# Patient Record
Sex: Male | Born: 1965 | Race: White | Hispanic: No | Marital: Single | Smoking: Current some day smoker
Health system: Southern US, Community
[De-identification: ages and names within clinical notes are randomized; demographics above are authoritative.]

## PROBLEM LIST (undated history)

## (undated) DIAGNOSIS — T7840XA Allergy, unspecified, initial encounter: Secondary | ICD-10-CM

## (undated) DIAGNOSIS — I10 Essential (primary) hypertension: Secondary | ICD-10-CM

## (undated) DIAGNOSIS — E785 Hyperlipidemia, unspecified: Secondary | ICD-10-CM

## (undated) DIAGNOSIS — Z720 Tobacco use: Secondary | ICD-10-CM

## (undated) DIAGNOSIS — E7800 Pure hypercholesterolemia, unspecified: Secondary | ICD-10-CM

## (undated) HISTORY — DX: Allergy, unspecified, initial encounter: T78.40XA

## (undated) HISTORY — DX: Tobacco use: Z72.0

## (undated) HISTORY — PX: NO PAST SURGERIES: SHX2092

## (undated) HISTORY — DX: Hyperlipidemia, unspecified: E78.5

---

## 2015-08-23 ENCOUNTER — Encounter: Payer: Self-pay | Admitting: *Deleted

## 2015-08-23 ENCOUNTER — Encounter: Payer: Self-pay | Admitting: Family Medicine

## 2015-08-23 ENCOUNTER — Ambulatory Visit (INDEPENDENT_AMBULATORY_CARE_PROVIDER_SITE_OTHER): Payer: BLUE CROSS/BLUE SHIELD | Admitting: Family Medicine

## 2015-08-23 VITALS — BP 98/64 | HR 68 | Temp 98.4°F | Resp 16 | Ht 72.0 in | Wt 165.0 lb

## 2015-08-23 DIAGNOSIS — E785 Hyperlipidemia, unspecified: Secondary | ICD-10-CM

## 2015-08-23 DIAGNOSIS — Z720 Tobacco use: Secondary | ICD-10-CM | POA: Diagnosis not present

## 2015-08-23 DIAGNOSIS — R634 Abnormal weight loss: Secondary | ICD-10-CM | POA: Diagnosis not present

## 2015-08-23 DIAGNOSIS — G4700 Insomnia, unspecified: Secondary | ICD-10-CM

## 2015-08-23 DIAGNOSIS — R002 Palpitations: Secondary | ICD-10-CM | POA: Insufficient documentation

## 2015-08-23 DIAGNOSIS — R079 Chest pain, unspecified: Secondary | ICD-10-CM

## 2015-08-23 DIAGNOSIS — Z125 Encounter for screening for malignant neoplasm of prostate: Secondary | ICD-10-CM | POA: Diagnosis not present

## 2015-08-23 NOTE — Progress Notes (Signed)
Patient ID: Martin Holland, male   DOB: 08/10/1966, 49 y.o.   MRN: [id number]       Patient: Martin Holland Male    DOB: 1966/11/22   49 y.o.   MRN: [id number] Visit Date: 08/23/2015  Today's Provider: Lelon Huh, MD   No chief complaint on file.  Subjective:    HPI Weight loss Patient reports that he has unexpectedly loss almost 20lbs in the past 6 months. He states he has felt well and only noticed it because acquaintances have been commenting that he looks like he is losing weight. Patient reports that he has not changed eating habits. However, he tends to skip breakfast and does not eat dinner until 8-9 o'clock. No nausea or appetite loss. No cough, fevers, chills or sweats. No changes in bowels.   He has had several episodes of tightness in his chest, not associated with exertion. They are mild in severity and resolve spontaneously after 4 or 5 minutes. They have been occuring a few time a week for awhile.   He also has long history of migraines usually with visual trigger such as bright lights. In the past he has only had these every year or so, but has had two of them in the last month. They last a half hour or so and are identical to migraines he has had in the past.       No Known Allergies Previous Medications   MULTIPLE VITAMIN PO    Take 1 capsule by mouth daily.    Review of Systems  Constitutional: Positive for unexpected weight change. Negative for fever, chills and appetite change.  Respiratory: Negative.  Negative for shortness of breath and wheezing.   Cardiovascular: Positive for palpitations. Negative for chest pain.  Gastrointestinal: Negative.  Negative for nausea, vomiting and abdominal pain.  Musculoskeletal: Positive for myalgias.  Psychiatric/Behavioral: Negative for behavioral problems and dysphoric mood. The patient is not nervous/anxious.        Insomnia   Patient Active Problem List   Diagnosis Date Noted  . Hyperlipidemia 08/23/2015  .  Palpitations 08/23/2015  . Tobacco abuse 08/23/2015    Social History  Substance Use Topics  . Smoking status: Current Every Day Smoker  . Smokeless tobacco: Not on file  . Alcohol Use: 0.0 oz/week    0 Standard drinks or equivalent per week   Objective:   BP 98/64 mmHg  Pulse 68  Temp(Src) 98.4 F (36.9 C)  Resp 16  Ht 6' (1.829 m)  Wt 165 lb (74.844 kg)  BMI 22.37 kg/m2  Physical Exam   General Appearance:    Alert, cooperative, no distress  Eyes:    PERRL, conjunctiva/corneas clear, EOM's intact       Lungs:     Clear to auscultation bilaterally, respirations unlabored  Heart:    Regular rate and rhythm  Neurologic:   Awake, alert, oriented x 3. No apparent focal neurological           defect.   Abd:   Soft, non-tender, no masses, bowel sounds normoactive x 4, no tenderness.        Assessment & Plan:     1. Unintentional weight loss  - CBC - Comprehensive metabolic panel - T4 AND TSH - Lipid panel  2. Insomnia   3. Hyperlipidemia He stopped pravastatin after a few months due severe muscle cramps, and has since been on healthier low fat diet.   4. Tobacco abuse Stop smoking  5. Prostate cancer screening  -  PSA  6. Chest pain, unspecified chest pain type Non typical for cardiac pain, but he does have several cardiac risk factors including smoking and very high cholesterol.  - DG Chest 2 View; Future - EKG 12-Lead - Troponin I       Lelon Huh, MD  [facility]

## 2015-08-23 NOTE — Patient Instructions (Signed)
Remmber to take your lab requisition to [facility] on Monday You do not need a requisition for your Chest Xray at [facility]

## 2015-08-24 ENCOUNTER — Ambulatory Visit
Admission: RE | Admit: 2015-08-24 | Discharge: 2015-08-24 | Disposition: A | Discharge status: Home / Self Care | Payer: BLUE CROSS/BLUE SHIELD | Source: Ambulatory Visit | Attending: Family Medicine | Admitting: Family Medicine

## 2015-08-24 ENCOUNTER — Other Ambulatory Visit
Admission: RE | Admit: 2015-08-24 | Discharge: 2015-08-24 | Disposition: A | Discharge status: Home / Self Care | Payer: BLUE CROSS/BLUE SHIELD | Source: Ambulatory Visit | Attending: Family Medicine | Admitting: Family Medicine

## 2015-08-24 DIAGNOSIS — R079 Chest pain, unspecified: Secondary | ICD-10-CM | POA: Diagnosis present

## 2015-08-24 LAB — CBC
HCT: 46.9 % (ref 40.0–52.0)
HEMOGLOBIN: 16 g/dL (ref 13.0–18.0)
MCH: 31.3 pg (ref 26.0–34.0)
MCHC: 34.1 g/dL (ref 32.0–36.0)
MCV: 91.7 fL (ref 80.0–100.0)
Platelets: 181 10*3/uL (ref 150–440)
RBC: 5.12 MIL/uL (ref 4.40–5.90)
RDW: 13.1 % (ref 11.5–14.5)
WBC: 7.6 10*3/uL (ref 3.8–10.6)

## 2015-08-24 LAB — COMPREHENSIVE METABOLIC PANEL
ALK PHOS: 63 U/L (ref 38–126)
ALT: 19 U/L (ref 17–63)
AST: 20 U/L (ref 15–41)
Albumin: 4.6 g/dL (ref 3.5–5.0)
Anion gap: 9 (ref 5–15)
BUN: 14 mg/dL (ref 6–20)
CALCIUM: 9.4 mg/dL (ref 8.9–10.3)
CO2: 27 mmol/L (ref 22–32)
CREATININE: 0.9 mg/dL (ref 0.61–1.24)
Chloride: 101 mmol/L (ref 101–111)
GFR calc non Af Amer: >60 mL/min (ref >60)
GLUCOSE: 102 mg/dL — AB (ref 65–99)
Potassium: 4.4 mmol/L (ref 3.5–5.1)
SODIUM: 137 mmol/L (ref 135–145)
Total Bilirubin: 0.6 mg/dL (ref 0.3–1.2)
Total Protein: 7.7 g/dL (ref 6.5–8.1)

## 2015-08-26 ENCOUNTER — Other Ambulatory Visit
Admission: RE | Admit: 2015-08-26 | Discharge: 2015-08-26 | Disposition: A | Discharge status: Home / Self Care | Payer: BLUE CROSS/BLUE SHIELD | Source: Ambulatory Visit | Attending: Family Medicine | Admitting: Family Medicine

## 2015-08-26 DIAGNOSIS — R634 Abnormal weight loss: Secondary | ICD-10-CM | POA: Diagnosis not present

## 2015-08-26 DIAGNOSIS — Z125 Encounter for screening for malignant neoplasm of prostate: Secondary | ICD-10-CM | POA: Diagnosis present

## 2015-08-26 DIAGNOSIS — R079 Chest pain, unspecified: Secondary | ICD-10-CM | POA: Diagnosis not present

## 2015-08-26 LAB — TROPONIN I

## 2015-08-26 LAB — LIPID PANEL
CHOL/HDL RATIO: 4.6 ratio
Cholesterol: 234 mg/dL — ABNORMAL HIGH (ref 0–200)
HDL: 51 mg/dL (ref >40)
LDL CALC: 153 mg/dL — AB (ref 0–99)
Triglycerides: 149 mg/dL (ref <150)
VLDL: 30 mg/dL (ref 0–40)

## 2015-08-26 LAB — TSH: TSH: 1.04 u[IU]/mL (ref 0.350–4.500)

## 2015-08-27 ENCOUNTER — Telehealth: Payer: Self-pay | Admitting: Family Medicine

## 2015-08-27 LAB — T4: T4 TOTAL: 7.7 ug/dL (ref 4.5–12.0)

## 2015-08-27 NOTE — Telephone Encounter (Signed)
Pt states he had chest x-ray and lab work done at the urgent care in [place] on Saturday.  Pt is asking if we have results from these.  HB#[phone]/BO

## 2015-08-27 NOTE — Telephone Encounter (Signed)
Left detailed message vm letting pt know we did receive results.

## 2015-08-30 ENCOUNTER — Encounter: Payer: Self-pay | Admitting: Family Medicine

## 2015-09-06 ENCOUNTER — Telehealth: Payer: Self-pay | Admitting: Family Medicine

## 2015-09-06 NOTE — Telephone Encounter (Addendum)
LMOVM for pt to return call. See results note.

## 2015-09-06 NOTE — Telephone Encounter (Signed)
Pt is returning call.  OB#[phone]/[id number]

## 2015-09-10 ENCOUNTER — Telehealth: Payer: Self-pay | Admitting: Family Medicine

## 2015-09-10 NOTE — Telephone Encounter (Signed)
Pt is returning a call to [place].  GO#[phone]/DH

## 2015-09-10 NOTE — Telephone Encounter (Signed)
Left message to call back.  Thanks, 

## 2015-09-26 ENCOUNTER — Encounter: Payer: Self-pay | Admitting: *Deleted

## 2015-09-26 NOTE — Telephone Encounter (Signed)
Please see other message in lab results-aa

## 2015-10-01 ENCOUNTER — Encounter: Payer: Self-pay | Admitting: Family Medicine

## 2015-10-01 LAB — PSA, SERUM (SERIAL MONITOR)

## 2016-04-13 DIAGNOSIS — I10 Essential (primary) hypertension: Secondary | ICD-10-CM | POA: Insufficient documentation

## 2016-04-13 DIAGNOSIS — K219 Gastro-esophageal reflux disease without esophagitis: Secondary | ICD-10-CM | POA: Insufficient documentation

## 2016-04-14 ENCOUNTER — Telehealth: Payer: Self-pay | Admitting: Family Medicine

## 2016-04-14 DIAGNOSIS — R7303 Prediabetes: Secondary | ICD-10-CM | POA: Insufficient documentation

## 2016-04-14 NOTE — Telephone Encounter (Signed)
Pt was discharged from [facility] today.  Pt will come by to complete a medical record release form.  I have scheduled a hospital follow up appointment/MW

## 2016-04-28 ENCOUNTER — Inpatient Hospital Stay: Payer: BLUE CROSS/BLUE SHIELD | Admitting: Family Medicine

## 2016-05-11 ENCOUNTER — Ambulatory Visit (INDEPENDENT_AMBULATORY_CARE_PROVIDER_SITE_OTHER): Payer: BLUE CROSS/BLUE SHIELD | Admitting: Family Medicine

## 2016-05-11 ENCOUNTER — Encounter: Payer: Self-pay | Admitting: Family Medicine

## 2016-05-11 VITALS — BP 112/72 | HR 62 | Temp 98.0°F | Resp 16 | Ht 72.0 in | Wt 168.0 lb

## 2016-05-11 DIAGNOSIS — Z1211 Encounter for screening for malignant neoplasm of colon: Secondary | ICD-10-CM | POA: Diagnosis not present

## 2016-05-11 DIAGNOSIS — I10 Essential (primary) hypertension: Secondary | ICD-10-CM | POA: Diagnosis not present

## 2016-05-11 DIAGNOSIS — R079 Chest pain, unspecified: Secondary | ICD-10-CM | POA: Insufficient documentation

## 2016-05-11 NOTE — Progress Notes (Signed)
       Patient: Martin Holland Male    DOB: 06/09/1966   50 y.o.   MRN: [id number] Visit Date: 05/11/2016  Today's Provider: Lelon Huh, MD   Chief Complaint  Patient presents with  . Hospitalization Follow-up   Subjective:    HPI   Follow up Hospitalization  Patient was admitted to [facility] on 04/13/2016 and discharged on 04/14/2016. He was treated for accelerated HTN and Chest pain. Treatment for this included patient was started on HCTZ 12.5 mg qd and Protonix 40 mg qd. Patient to continue taking 325 mg aspirin and discontinue ibuprofen. Patient was free of chest pain upon discharge. Advised to follow-up with pcp in 4 weeks after discharge.   He had stress echo which was interpretated as low risk. Chest CT was completely normal. He was also noted to have LDL=160 with chol:HDL ratio=5 He was also started on pantoprazole, but states he rarely has heartburn.   He reports good compliance with treatment. He reports this condition is Improved.  ----------------------------------------------------------------------      Allergies  Allergen Reactions  . Pravastatin Sodium     Severe muscle cramps   Current Meds  Medication Sig  . aspirin 325 MG tablet Take 1 tablet by mouth daily.  . hydrochlorothiazide (HYDRODIURIL) 12.5 MG tablet Take 1 tablet by mouth daily.  . pantoprazole (PROTONIX) 40 MG tablet Take 1 tablet by mouth daily.         Review of Systems  Constitutional: Negative for fever, chills and appetite change.  Respiratory: Negative for chest tightness, shortness of breath and wheezing.   Cardiovascular: Negative for chest pain and palpitations.  Gastrointestinal: Negative for nausea, vomiting and abdominal pain.    Social History  Substance Use Topics  . Smoking status: Current Every Day Smoker  . Smokeless tobacco: Not on file  . Alcohol Use: 0.0 oz/week    0 Standard drinks or equivalent per week   Objective:   BP 112/72 mmHg  Pulse 62   Temp(Src) 98 F (36.7 C) (Oral)  Resp 16  Ht 6' (1.829 m)  Wt 168 lb (76.204 kg)  BMI 22.78 kg/m2  SpO2 98%  Physical Exam   General Appearance:    Alert, cooperative, no distress  Eyes:    PERRL, conjunctiva/corneas clear, EOM's intact       Lungs:     Clear to auscultation bilaterally, respirations unlabored  Heart:    Regular rate and rhythm  Neurologic:   Awake, alert, oriented x 3. No apparent focal neurological           defect.           Assessment & Plan:     1. Essential hypertension Doing well since starting 12.5mg  hctz daily. Counseled on importance of smoking cessation  Return in 3 months.   2. Chest pain, unspecified chest pain type Negative cardiac work up. May have been related to accelerated hypertension. He is to complete one month of PPI.   3. Colon cancer screening  - Ambulatory referral to Gastroenterology      The entirety of the information documented in the History of Present Illness, Review of Systems and Physical Exam were personally obtained by me. Portions of this information were initially documented by April M. Sabra Heck, CMA and reviewed by me for thoroughness and accuracy.    Lelon Huh, MD  [facility]

## 2016-07-13 ENCOUNTER — Telehealth: Payer: Self-pay | Admitting: Gastroenterology

## 2016-07-13 NOTE — Telephone Encounter (Signed)
colonoscopy

## 2016-07-15 NOTE — Telephone Encounter (Signed)
Called patient and left a voicemail 

## 2016-07-16 ENCOUNTER — Other Ambulatory Visit: Payer: Self-pay

## 2016-07-16 NOTE — Telephone Encounter (Signed)
Gastroenterology Pre-Procedure Review  Request Date: *08/14/16* Requesting Physician: Dr.   PATIENT REVIEW QUESTIONS: The patient responded to the following health history questions as indicated:    1. Are you having any GI issues? no 2. Do you have a personal history of Polyps? no 3. Do you have a family history of Colon Cancer or Polyps? no 4. Diabetes Mellitus? no 5. Joint replacements in the past 12 months?no 6. Major health problems in the past 3 months?no 7. Any artificial heart valves, MVP, or defibrillator?no    MEDICATIONS & ALLERGIES:    Patient reports the following regarding taking any anticoagulation/antiplatelet therapy:   Plavix, Coumadin, Eliquis, Xarelto, Lovenox, Pradaxa, Brilinta, or Effient? no Aspirin? yes (ASA 325mg )  Patient confirms/reports the following medications:  Current Outpatient Prescriptions  Medication Sig Dispense Refill  . aspirin 325 MG tablet Take 1 tablet by mouth daily.    . hydrochlorothiazide (HYDRODIURIL) 12.5 MG tablet Take 1 tablet by mouth daily.    . MULTIPLE VITAMIN PO Take 1 capsule by mouth daily. Reported on 05/11/2016    . pantoprazole (PROTONIX) 40 MG tablet Take 1 tablet by mouth daily.     No current facility-administered medications for this visit.     Patient confirms/reports the following allergies:  Allergies  Allergen Reactions  . Pravastatin Sodium     Severe muscle cramps    No orders of the defined types were placed in this encounter.   AUTHORIZATION INFORMATION Primary Insurance: 1D#: Group #:  Secondary Insurance: 1D#: Group #:  SCHEDULE INFORMATION: Date: 08/14/16 Time: Location: [place]

## 2016-08-07 ENCOUNTER — Encounter: Payer: Self-pay | Admitting: *Deleted

## 2016-08-07 NOTE — Discharge Instructions (Signed)

## 2016-08-14 ENCOUNTER — Ambulatory Visit
Admission: RE | Admit: 2016-08-14 | Discharge: 2016-08-14 | Disposition: A | Discharge status: Home / Self Care | Payer: BLUE CROSS/BLUE SHIELD | Source: Ambulatory Visit | Attending: Gastroenterology | Admitting: Gastroenterology

## 2016-08-14 ENCOUNTER — Ambulatory Visit: Payer: BLUE CROSS/BLUE SHIELD | Admitting: Anesthesiology

## 2016-08-14 ENCOUNTER — Encounter
Admission: RE | Disposition: A | Discharge status: Home / Self Care | Payer: Self-pay | Source: Ambulatory Visit | Attending: Gastroenterology

## 2016-08-14 DIAGNOSIS — F17200 Nicotine dependence, unspecified, uncomplicated: Secondary | ICD-10-CM | POA: Insufficient documentation

## 2016-08-14 DIAGNOSIS — Z8249 Family history of ischemic heart disease and other diseases of the circulatory system: Secondary | ICD-10-CM | POA: Diagnosis not present

## 2016-08-14 DIAGNOSIS — K5730 Diverticulosis of large intestine without perforation or abscess without bleeding: Secondary | ICD-10-CM | POA: Diagnosis not present

## 2016-08-14 DIAGNOSIS — Z1211 Encounter for screening for malignant neoplasm of colon: Secondary | ICD-10-CM | POA: Diagnosis not present

## 2016-08-14 DIAGNOSIS — Z809 Family history of malignant neoplasm, unspecified: Secondary | ICD-10-CM | POA: Diagnosis not present

## 2016-08-14 DIAGNOSIS — D125 Benign neoplasm of sigmoid colon: Secondary | ICD-10-CM | POA: Diagnosis not present

## 2016-08-14 DIAGNOSIS — E785 Hyperlipidemia, unspecified: Secondary | ICD-10-CM | POA: Insufficient documentation

## 2016-08-14 DIAGNOSIS — Z7982 Long term (current) use of aspirin: Secondary | ICD-10-CM | POA: Insufficient documentation

## 2016-08-14 DIAGNOSIS — Z888 Allergy status to other drugs, medicaments and biological substances status: Secondary | ICD-10-CM | POA: Insufficient documentation

## 2016-08-14 DIAGNOSIS — I10 Essential (primary) hypertension: Secondary | ICD-10-CM | POA: Diagnosis not present

## 2016-08-14 DIAGNOSIS — Z79899 Other long term (current) drug therapy: Secondary | ICD-10-CM | POA: Diagnosis not present

## 2016-08-14 DIAGNOSIS — K640 First degree hemorrhoids: Secondary | ICD-10-CM | POA: Insufficient documentation

## 2016-08-14 DIAGNOSIS — Z860101 Personal history of adenomatous and serrated colon polyps: Secondary | ICD-10-CM

## 2016-08-14 DIAGNOSIS — Z86010 Personal history of colonic polyps: Secondary | ICD-10-CM

## 2016-08-14 HISTORY — PX: COLONOSCOPY WITH PROPOFOL: SHX5780

## 2016-08-14 HISTORY — DX: Essential (primary) hypertension: I10

## 2016-08-14 SURGERY — COLONOSCOPY WITH PROPOFOL
Anesthesia: Monitor Anesthesia Care

## 2016-08-14 MED ORDER — LIDOCAINE HCL (CARDIAC) 20 MG/ML IV SOLN
INTRAVENOUS | Status: DC | PRN
  Administered 2016-08-14: 40 mg via INTRAVENOUS

## 2016-08-14 MED ORDER — PROPOFOL 10 MG/ML IV BOLUS
INTRAVENOUS | Status: DC | PRN
  Administered 2016-08-14 (×6): 50 mg via INTRAVENOUS

## 2016-08-14 MED ORDER — STERILE WATER FOR IRRIGATION IR SOLN
Status: DC | PRN
  Administered 2016-08-14: 10:00:00

## 2016-08-14 MED ORDER — LACTATED RINGERS IV SOLN: 500.0000 mL | INTRAVENOUS | Status: DC

## 2016-08-14 MED ORDER — LACTATED RINGERS IV SOLN
INTRAVENOUS | Status: DC
  Administered 2016-08-14: 09:00:00 via INTRAVENOUS

## 2016-08-14 SURGICAL SUPPLY — 23 items

## 2016-08-14 NOTE — Anesthesia Postprocedure Evaluation (Signed)
Anesthesia Post Note  Patient: Martin Holland  Procedure(s) Performed: Procedure(s) (LRB): COLONOSCOPY WITH PROPOFOL (N/A)  Patient location during evaluation: PACU Anesthesia Type: MAC Level of consciousness: awake and alert Pain management: pain level controlled Vital Signs Assessment: post-procedure vital signs reviewed and stable Respiratory status: spontaneous breathing, nonlabored ventilation and respiratory function stable Cardiovascular status: stable and blood pressure returned to baseline Anesthetic complications: no    DANIEL D KOVACS

## 2016-08-14 NOTE — Transfer of Care (Signed)
Immediate Anesthesia Transfer of Care Note  Patient: Martin Holland  Procedure(s) Performed: Procedure(s): COLONOSCOPY WITH PROPOFOL (N/A)  Patient Location: PACU  Anesthesia Type: MAC  Level of Consciousness: awake, alert  and patient cooperative  Airway and Oxygen Therapy: Patient Spontanous Breathing and Patient connected to supplemental oxygen  Post-op Assessment: Post-op Vital signs reviewed, Patient's Cardiovascular Status Stable, Respiratory Function Stable, Patent Airway and No signs of Nausea or vomiting  Post-op Vital Signs: Reviewed and stable  Complications: No apparent anesthesia complications

## 2016-08-14 NOTE — H&P (Signed)
  Lucilla Lame, MD [facility] [address], [place], [place] [address] Phone: [phone] Fax : [phone]  Primary Care Physician:  Lelon Huh, MD Primary Gastroenterologist:  Dr. Allen Norris  Pre-Procedure History & Physical: HPI:  Martin Holland is a 50 y.o. male is here for a screening colonoscopy.   Past Medical History:  Diagnosis Date  . Hyperlipidemia   . Hypertension     Past Surgical History:  Procedure Laterality Date  . NO PAST SURGERIES      Prior to Admission medications   Medication Sig Start Date End Date Taking? Authorizing Provider  aspirin 325 MG tablet Take 1 tablet by mouth daily.   Yes Historical Provider, MD  hydrochlorothiazide (HYDRODIURIL) 12.5 MG tablet Take 1 tablet by mouth daily. 04/14/16 04/14/17 Yes Historical Provider, MD  MULTIPLE VITAMIN PO Take 1 capsule by mouth daily. Reported on 05/11/2016   Yes Historical Provider, MD    Allergies as of 07/16/2016 - Review Complete 05/11/2016  Allergen Reaction Noted  . Pravastatin sodium  08/24/2015    Family History  Problem Relation Age of Onset  . Heart disease Father   . Cancer Mother     small cell carcinoma    Social History   Social History  . Marital status: Single    Spouse name: N/A  . Number of children: 0  . Years of education: N/A   Occupational History  . Employeed    Social History Main Topics  . Smoking status: Current Every Day Smoker    Packs/day: 0.25    Years: 20.00  . Smokeless tobacco: Never Used  . Alcohol use 2.4 oz/week    4 Cans of beer per week  . Drug use:     Types: Marijuana  . Sexual activity: Not on file   Other Topics Concern  . Not on file   Social History Narrative  . No narrative on file    Review of Systems: See HPI, otherwise negative ROS  Physical Exam: BP (!) 144/92   Pulse 64   Temp 98 F (36.7 C) (Temporal)   Ht 6' (1.829 m)   Wt 163 lb (73.9 kg)   SpO2 99%   BMI 22.11 kg/m  General:   Alert,  pleasant and cooperative in  NAD Head:  Normocephalic and atraumatic. Neck:  Supple; no masses or thyromegaly. Lungs:  Clear throughout to auscultation.    Heart:  Regular rate and rhythm. Abdomen:  Soft, nontender and nondistended. Normal bowel sounds, without guarding, and without rebound.   Neurologic:  Alert and  oriented x4;  grossly normal neurologically.  Impression/Plan: Martin Holland is now here to undergo a screening colonoscopy.  Risks, benefits, and alternatives regarding colonoscopy have been reviewed with the patient.  Questions have been answered.  All parties agreeable.

## 2016-08-14 NOTE — Anesthesia Preprocedure Evaluation (Signed)
Anesthesia Evaluation  Patient identified by MRN, date of birth, ID band Patient awake    Reviewed: Allergy & Precautions, H&P , NPO status , Patient's Chart, lab work & pertinent test results, reviewed documented beta blocker date and time   Airway Mallampati: II  TM Distance: >3 FB Neck ROM: full    Dental no notable dental hx.    Pulmonary Current Smoker,    Pulmonary exam normal breath sounds clear to auscultation       Cardiovascular Exercise Tolerance: Good hypertension,  Rhythm:regular Rate:Normal     Neuro/Psych negative neurological ROS  negative psych ROS   GI/Hepatic negative GI ROS, Neg liver ROS,   Endo/Other  negative endocrine ROS  Renal/GU negative Renal ROS  negative genitourinary   Musculoskeletal   Abdominal   Peds  Hematology negative hematology ROS (+)   Anesthesia Other Findings   Reproductive/Obstetrics negative OB ROS                             Anesthesia Physical Anesthesia Plan  ASA: II  Anesthesia Plan: MAC   Post-op Pain Management:    Induction:   Airway Management Planned:   Additional Equipment:   Intra-op Plan:   Post-operative Plan:   Informed Consent: I have reviewed the patients History and Physical, chart, labs and discussed the procedure including the risks, benefits and alternatives for the proposed anesthesia with the patient or authorized representative who has indicated his/her understanding and acceptance.     Plan Discussed with: CRNA  Anesthesia Plan Comments:         Anesthesia Quick Evaluation  

## 2016-08-14 NOTE — Op Note (Signed)
[facility] Gastroenterology Patient Name: Martin Holland Procedure Date: 08/14/2016 9:42 AM MRN: [id number] Account #: [id number] Date of Birth: December 10, 1965 Admit Type: Outpatient Age: 50 Room: [facility] OR ROOM 01 Gender: Male Note Status: Finalized Procedure:            Colonoscopy Indications:          Screening for colorectal malignant neoplasm Providers:            Lucilla Lame MD, MD Referring MD:         Kirstie Peri. Caryn Section, MD (Referring MD) Medicines:            Propofol per Anesthesia Complications:        No immediate complications. Procedure:            Pre-Anesthesia Assessment:                       - Prior to the procedure, a History and Physical was                        performed, and patient medications and allergies were                        reviewed. The patient's tolerance of previous                        anesthesia was also reviewed. The risks and benefits of                        the procedure and the sedation options and risks were                        discussed with the patient. All questions were                        answered, and informed consent was obtained. Prior                        Anticoagulants: The patient has taken no previous                        anticoagulant or antiplatelet agents. ASA Grade                        Assessment: II - A patient with mild systemic disease.                        After reviewing the risks and benefits, the patient was                        deemed in satisfactory condition to undergo the                        procedure.                       After obtaining informed consent, the colonoscope was                        passed under direct vision. Throughout the procedure,  the patient's blood pressure, pulse, and oxygen                        saturations were monitored continuously. The Olympus                        CF-HQ190L Colonoscope (S#. [id number]) was introduced                      through the anus and advanced to the the terminal                        ileum. The colonoscopy was performed without                        difficulty. The patient tolerated the procedure well.                        The quality of the bowel preparation was excellent. Findings:      The perianal and digital rectal examinations were normal.      The terminal ileum appeared normal.      Two sessile polyps were found in the sigmoid colon. The polyps were 2 to       3 mm in size. These polyps were removed with a cold biopsy forceps.       Resection and retrieval were complete.      Multiple small-mouthed diverticula were found in the sigmoid colon.      Non-bleeding internal hemorrhoids were found during retroflexion. The       hemorrhoids were Grade I (internal hemorrhoids that do not prolapse). Impression:           - The examined portion of the ileum was normal.                       - Two 2 to 3 mm polyps in the sigmoid colon, removed                        with a cold biopsy forceps. Resected and retrieved.                       - Diverticulosis in the sigmoid colon.                       - Non-bleeding internal hemorrhoids. Recommendation:       - Discharge patient to home.                       - Resume previous diet.                       - Continue present medications.                       - Await pathology results.                       - Repeat colonoscopy in 5 years if polyp adenoma and 10                        years if hyperplastic Procedure Code(s):    --- Professional ---  45380, Colonoscopy, flexible; with biopsy, single or                        multiple Diagnosis Code(s):    --- Professional ---                       Z12.11, Encounter for screening for malignant neoplasm                        of colon                       D12.5, Benign neoplasm of sigmoid colon CPT copyright 2016 American Medical Association. All rights  reserved. The codes documented in this report are preliminary and upon coder review may  be revised to meet current compliance requirements. Lucilla Lame MD, MD 08/14/2016 10:11:28 AM This report has been signed electronically. Number of Addenda: 0 Note Initiated On: 08/14/2016 9:42 AM Total Procedure Duration: 0 hours 10 minutes 1 second       [facility]

## 2016-08-17 ENCOUNTER — Encounter: Payer: Self-pay | Admitting: Gastroenterology

## 2016-08-18 ENCOUNTER — Encounter: Payer: Self-pay | Admitting: Gastroenterology

## 2017-03-22 ENCOUNTER — Other Ambulatory Visit: Payer: Self-pay | Admitting: Family Medicine

## 2017-03-22 NOTE — Telephone Encounter (Signed)
Pt needs new yearly refill   hydrochlorothiazide (HYDRODIURIL) 12.5 MG tablet  [facility] [address]  Pt's call back [phone]  Thanks Con Memos

## 2017-03-23 ENCOUNTER — Other Ambulatory Visit: Payer: Self-pay | Admitting: *Deleted

## 2017-03-23 MED ORDER — HYDROCHLOROTHIAZIDE 12.5 MG PO TABS: 12.5000 mg | ORAL_TABLET | Freq: Every day | ORAL | 5 refills | Status: DC

## 2017-03-23 MED ORDER — HYDROCHLOROTHIAZIDE 12.5 MG PO TABS: 12.5000 mg | ORAL_TABLET | Freq: Every day | ORAL | 2 refills | Status: DC

## 2017-11-26 ENCOUNTER — Ambulatory Visit: Payer: Self-pay | Admitting: Family Medicine

## 2017-11-26 ENCOUNTER — Encounter: Payer: Self-pay | Admitting: Family Medicine

## 2017-11-26 VITALS — BP 110/84 | HR 80 | Temp 98.2°F | Resp 16 | Wt 170.0 lb

## 2017-11-26 DIAGNOSIS — R630 Anorexia: Secondary | ICD-10-CM

## 2017-11-26 DIAGNOSIS — Z23 Encounter for immunization: Secondary | ICD-10-CM

## 2017-11-26 NOTE — Progress Notes (Signed)
Patient: Martin Holland Male    DOB: 05-23-1966   51 y.o.   MRN: [id number] Visit Date: 11/26/2017  Today's Provider: Lelon Huh, MD   No chief complaint on file.  Subjective:    Abdominal Cramping  This is a recurrent problem. The current episode started more than 1 month ago (3 months). The onset quality is gradual. The problem occurs daily. The problem has been unchanged. The pain is located in the generalized abdominal region. The pain is mild. The quality of the pain is cramping. Associated symptoms include anorexia, belching, diarrhea, flatus and weight loss. Pertinent negatives include no constipation, dysuria, fever, frequency, headaches, hematochezia, hematuria, melena, myalgias, nausea or vomiting. The pain is aggravated by eating. The pain is relieved by liquids. The treatment provided moderate relief.   First noticed symptoms the morning after he had been drinking with friends at beach and lasted a few days. Symptoms mostly resolved but flares up from time to time since then, most recently about a week ago. States he feels full very quickly and does not want to eat anymore after taking a few bites, or sips of soup. No heartburn. Rarely has burning in stomach. No feeling of food getting stuck in throat or chest. No change in bowel movements. Cramping is very brief and mild. Has never been a regular smoker. He is a light social drinker. Typically has three or four drinks a few times a week or month.    Wt Readings from Last 3 Encounters:  11/26/17 170 lb (77.1 kg)  08/14/16 163 lb (73.9 kg)  05/11/16 168 lb (76.2 kg)       Allergies  Allergen Reactions  . Pravastatin Sodium     Severe muscle cramps     Current Outpatient Medications:  .  aspirin 325 MG tablet, Take 1 tablet by mouth daily., Disp: , Rfl:  .  hydrochlorothiazide (HYDRODIURIL) 12.5 MG tablet, Take 1 tablet (12.5 mg total) by mouth daily., Disp: 90 tablet, Rfl: 2 .  MULTIPLE VITAMIN PO, Take 1  capsule by mouth daily. Reported on 05/11/2016, Disp: , Rfl:   Review of Systems  Constitutional: Positive for weight loss. Negative for appetite change, chills and fever.  Respiratory: Negative for chest tightness, shortness of breath and wheezing.   Cardiovascular: Negative for chest pain and palpitations.  Gastrointestinal: Positive for anorexia, diarrhea and flatus. Negative for constipation, hematochezia, melena, nausea and vomiting.  Genitourinary: Negative for dysuria, frequency and hematuria.  Musculoskeletal: Negative for myalgias.  Neurological: Negative for headaches.    Social History   Tobacco Use  . Smoking status: Current Every Day Smoker    Packs/day: 0.25    Years: 20.00    Pack years: 5.00  . Smokeless tobacco: Never Used  Substance Use Topics  . Alcohol use: Yes    Alcohol/week: 2.4 oz    Types: 4 Cans of beer per week   Objective:   BP 110/84 (BP Location: Right Arm, Patient Position: Sitting, Cuff Size: Normal)   Pulse 80   Temp 98.2 F (36.8 C) (Oral)   Resp 16   Wt 170 lb (77.1 kg)   SpO2 98%   BMI 23.06 kg/m  There were no vitals filed for this visit.   Physical Exam  General Appearance:    Alert, cooperative, no distress  Eyes:    PERRL, conjunctiva/corneas clear, EOM's intact       Lungs:     Clear to auscultation bilaterally, respirations unlabored  Heart:    Regular rate and rhythm  Abdomen:   bowel sounds present and normal in all 4 quadrants, soft, round, nontender or nondistended. No CVA tenderness         Assessment & Plan:     1. Loss of appetite Vague and non-specific GI symptoms. Weight is up compared to last year.  - CBC with Differential/Platelet - Comprehensive metabolic panel - Amylase - TSH  Consider upper GI series if labs normal and symptoms persist.   2. Need for influenza vaccination  - Flu Vaccine QUAD 36+ mos IM       Lelon Huh, MD  [facility]

## 2017-12-02 LAB — CBC WITH DIFFERENTIAL/PLATELET
BASOS: 1 %
Basophils Absolute: 0.1 10*3/uL (ref 0.0–0.2)
EOS (ABSOLUTE): 0.2 10*3/uL (ref 0.0–0.4)
EOS: 3 %
HEMATOCRIT: 41.7 % (ref 37.5–51.0)
Hemoglobin: 14.1 g/dL (ref 13.0–17.7)
IMMATURE GRANULOCYTES: 0 %
Immature Grans (Abs): 0 10*3/uL (ref 0.0–0.1)
LYMPHS ABS: 2.9 10*3/uL (ref 0.7–3.1)
Lymphs: 38 %
MCH: 30.7 pg (ref 26.6–33.0)
MCHC: 33.8 g/dL (ref 31.5–35.7)
MCV: 91 fL (ref 79–97)
Monocytes Absolute: 0.5 10*3/uL (ref 0.1–0.9)
Monocytes: 7 %
Neutrophils Absolute: 3.9 10*3/uL (ref 1.4–7.0)
Neutrophils: 51 %
Platelets: 227 10*3/uL (ref 150–379)
RBC: 4.6 x10E6/uL (ref 4.14–5.80)
RDW: 12.8 % (ref 12.3–15.4)
WBC: 7.6 10*3/uL (ref 3.4–10.8)

## 2017-12-02 LAB — COMPREHENSIVE METABOLIC PANEL
A/G RATIO: 1.6 (ref 1.2–2.2)
ALBUMIN: 4 g/dL (ref 3.5–5.5)
ALT: 13 IU/L (ref 0–44)
AST: 16 IU/L (ref 0–40)
Alkaline Phosphatase: 66 IU/L (ref 39–117)
BUN / CREAT RATIO: 12 (ref 9–20)
BUN: 12 mg/dL (ref 6–24)
Bilirubin Total: <0.2 mg/dL (ref 0.0–1.2)
CO2: 22 mmol/L (ref 20–29)
CREATININE: 0.97 mg/dL (ref 0.76–1.27)
Calcium: 9.1 mg/dL (ref 8.7–10.2)
Chloride: 104 mmol/L (ref 96–106)
GFR calc non Af Amer: 89 mL/min/{1.73_m2} (ref >59)
GFR, EST AFRICAN AMERICAN: 103 mL/min/{1.73_m2} (ref >59)
GLOBULIN, TOTAL: 2.5 g/dL (ref 1.5–4.5)
Glucose: 88 mg/dL (ref 65–99)
Potassium: 4.2 mmol/L (ref 3.5–5.2)
SODIUM: 140 mmol/L (ref 134–144)
Total Protein: 6.5 g/dL (ref 6.0–8.5)

## 2017-12-02 LAB — TSH: TSH: 1.97 u[IU]/mL (ref 0.450–4.500)

## 2017-12-02 LAB — AMYLASE: Amylase: 91 U/L (ref 31–124)

## 2018-01-07 ENCOUNTER — Telehealth: Payer: Self-pay | Admitting: Family Medicine

## 2018-01-07 NOTE — Telephone Encounter (Signed)
L/M stating below.  

## 2018-01-07 NOTE — Telephone Encounter (Signed)
Recommend Dr. Lisette Grinder at [facility],

## 2018-01-07 NOTE — Telephone Encounter (Signed)
Please review. Thanks!  

## 2018-01-07 NOTE — Telephone Encounter (Signed)
Pt stated that he has been having off and on knee pain. Pt is requesting that Dr. Caryn Section advise of which provider in the area that specializes in ortho that he would recommend pt seeing for his knee. Pt stated that he isn't requesting Dr. Caryn Section do a referral but make a suggestion as to whom pt should contact to get an appt. Please advise. Thanks TNP

## 2018-06-11 ENCOUNTER — Other Ambulatory Visit: Payer: Self-pay | Admitting: Family Medicine

## 2018-11-25 ENCOUNTER — Ambulatory Visit (INDEPENDENT_AMBULATORY_CARE_PROVIDER_SITE_OTHER): Payer: Self-pay | Admitting: Family Medicine

## 2018-11-25 DIAGNOSIS — Z23 Encounter for immunization: Secondary | ICD-10-CM

## 2018-12-15 ENCOUNTER — Other Ambulatory Visit: Payer: Self-pay | Admitting: Family Medicine

## 2018-12-27 ENCOUNTER — Ambulatory Visit: Payer: Self-pay | Admitting: Family Medicine

## 2018-12-27 ENCOUNTER — Encounter: Payer: Self-pay | Admitting: Family Medicine

## 2018-12-27 VITALS — BP 120/80 | HR 71 | Temp 97.8°F | Resp 16 | Ht 70.75 in | Wt 166.0 lb

## 2018-12-27 DIAGNOSIS — Z125 Encounter for screening for malignant neoplasm of prostate: Secondary | ICD-10-CM

## 2018-12-27 DIAGNOSIS — G43109 Migraine with aura, not intractable, without status migrainosus: Secondary | ICD-10-CM

## 2018-12-27 DIAGNOSIS — Z0000 Encounter for general adult medical examination without abnormal findings: Secondary | ICD-10-CM

## 2018-12-27 DIAGNOSIS — I10 Essential (primary) hypertension: Secondary | ICD-10-CM

## 2018-12-27 DIAGNOSIS — Z720 Tobacco use: Secondary | ICD-10-CM

## 2018-12-27 NOTE — Patient Instructions (Addendum)
The CDC recommends two doses of Shingrix (the shingles vaccine) separated by 2 to 6 months for adults age 637 years and older. I recommend checking with your insurance plan regarding coverage for this vaccine.   . I recommend that you take 24m enteric coated aspirin to reduce risk of vascular events such as heart attacks and strokes.    Preventive Care 40-64 Years, Male Preventive care refers to lifestyle choices and visits with your health care provider that can promote health and wellness. What does preventive care include?   A yearly physical exam. This is also called an annual well check.  Dental exams once or twice a year.  Routine eye exams. Ask your health care provider how often you should have your eyes checked.  Personal lifestyle choices, including: ? Daily care of your teeth and gums. ? Regular physical activity. ? Eating a healthy diet. ? Avoiding tobacco and drug use. ? Limiting alcohol use. ? Practicing safe sex. ? Taking low-dose aspirin every day starting at age 54 What happens during an annual well check? The services and screenings done by your health care provider during your annual well check will depend on your age, overall health, lifestyle risk factors, and family history of disease. Counseling Your health care provider may ask you questions about your:  Alcohol use.  Tobacco use.  Drug use.  Emotional well-being.  Home and relationship well-being.  Sexual activity.  Eating habits.  Work and work eStatistician Screening You may have the following tests or measurements:  Height, weight, and BMI.  Blood pressure.  Lipid and cholesterol levels. These may be checked every 5 years, or more frequently if you are over 59years old.  Skin check.  Lung cancer screening. You may have this screening every year starting at age 6363if you have a 30-pack-year history of smoking and currently smoke or have quit within the past 15 years.  Colorectal  cancer screening. All adults should have this screening starting at age 6379and continuing until age 54 Your health care provider may recommend screening at age 54 You will have tests every 1-10 years, depending on your results and the type of screening test. People at increased risk should start screening at an earlier age. Screening tests may include: ? Guaiac-based fecal occult blood testing. ? Fecal immunochemical test (FIT). ? Stool DNA test. ? Virtual colonoscopy. ? Sigmoidoscopy. During this test, a flexible tube with a tiny camera (sigmoidoscope) is used to examine your rectum and lower colon. The sigmoidoscope is inserted through your anus into your rectum and lower colon. ? Colonoscopy. During this test, a long, thin, flexible tube with a tiny camera (colonoscope) is used to examine your entire colon and rectum.  Prostate cancer screening. Recommendations will vary depending on your family history and other risks.  Hepatitis C blood test.  Hepatitis B blood test.  Sexually transmitted disease (STD) testing.  Diabetes screening. This is done by checking your blood sugar (glucose) after you have not eaten for a while (fasting). You may have this done every 1-3 years. Discuss your test results, treatment options, and if necessary, the need for more tests with your health care provider. Vaccines Your health care provider may recommend certain vaccines, such as:  Influenza vaccine. This is recommended every year.  Tetanus, diphtheria, and acellular pertussis (Tdap, Td) vaccine. You may need a Td booster every 10 years.  Varicella vaccine. You may need this if you have not been vaccinated.  Zoster vaccine. You  may need this after age 62.  Measles, mumps, and rubella (MMR) vaccine. You may need at least one dose of MMR if you were born in 1957 or later. You may also need a second dose.  Pneumococcal 13-valent conjugate (PCV13) vaccine. You may need this if you have certain  conditions and have not been vaccinated.  Pneumococcal polysaccharide (PPSV23) vaccine. You may need one or two doses if you smoke cigarettes or if you have certain conditions.  Meningococcal vaccine. You may need this if you have certain conditions.  Hepatitis A vaccine. You may need this if you have certain conditions or if you travel or work in places where you may be exposed to hepatitis A.  Hepatitis B vaccine. You may need this if you have certain conditions or if you travel or work in places where you may be exposed to hepatitis B.  Haemophilus influenzae type b (Hib) vaccine. You may need this if you have certain risk factors. Talk to your health care provider about which screenings and vaccines you need and how often you need them. This information is not intended to replace advice given to you by your health care provider. Make sure you discuss any questions you have with your health care provider. Document Released: 12/13/2015 Document Revised: 01/06/2018 Document Reviewed: 09/17/2015 Elsevier Interactive Patient Education  2019 [employer].

## 2018-12-27 NOTE — Progress Notes (Signed)
Patient: Martin Holland, Male    DOB: Feb 05, 1966, 53 y.o.   MRN: [id number] Visit Date: 12/27/2018  Today's Provider: Lelon Huh, MD   Chief Complaint  Patient presents with  . Annual Exam  . Hypertension  . Hyperlipidemia   Subjective:    Annual physical exam Martin Holland is a 53 y.o. male who presents today for health maintenance and complete physical. He feels well. He reports exercising daily. He reports he is sleeping fairly well  (trouble falling asleep).  -----------------------------------------------------------------  Hypertension, follow-up:  BP Readings from Last 3 Encounters:  12/27/18 120/80  11/26/17 110/84  08/14/16 125/79    He was last seen for hypertension greater than 1 years ago.  BP at that visit was 112/72. Management since that visit includes no changes. He reports good compliance with treatment. He is not having side effects.  He is exercising. He is adherent to low salt diet.   Outside blood pressures are 120/80. He is experiencing palpitations.  Patient denies chest pain, chest pressure/discomfort, claudication, dyspnea, exertional chest pressure/discomfort, fatigue, irregular heart beat, lower extremity edema, near-syncope, orthopnea, paroxysmal nocturnal dyspnea, syncope and tachypnea.   Cardiovascular risk factors include dyslipidemia and hypertension.  Use of agents associated with hypertension: NSAIDS.     Weight trend: decreasing steadily Wt Readings from Last 3 Encounters:  12/27/18 166 lb (75.3 kg)  11/26/17 170 lb (77.1 kg)  08/14/16 163 lb (73.9 kg)    Current diet: in general, an "unhealthy" diet  ------------------------------------------------------------------------  Lipid/Cholesterol, Follow-up:   Last seen for this greater than 1 years ago.  Management changes since that visit include none. . Last Lipid Panel:    Component Value Date/Time   CHOL 234 (H) 08/24/2015 1209   TRIG 149 08/24/2015 1209   HDL 51 08/24/2015 1209   CHOLHDL 4.6 08/24/2015 1209   VLDL 30 08/24/2015 1209   LDLCALC 153 (H) 08/24/2015 1209    Risk factors for vascular disease include hypercholesterolemia and hypertension  He reports good compliance with treatment. He is not having side effects.  Current symptoms include none and have been stable. Weight trend: decreasing steadily Prior visit with dietician: no Current diet: in general, an "unhealthy" diet Current exercise: walking  Wt Readings from Last 3 Encounters:  12/27/18 166 lb (75.3 kg)  11/26/17 170 lb (77.1 kg)  08/14/16 163 lb (73.9 kg)    -------------------------------------------------------------------   Review of Systems  Constitutional: Negative for appetite change, chills, fatigue and fever.  HENT: Negative for congestion, ear pain, hearing loss, nosebleeds and trouble swallowing.   Eyes: Positive for photophobia. Negative for pain and visual disturbance.  Respiratory: Negative for cough, chest tightness and shortness of breath.   Cardiovascular: Positive for palpitations. Negative for chest pain and leg swelling.  Gastrointestinal: Negative for abdominal pain, blood in stool, constipation, diarrhea, nausea and vomiting.  Endocrine: Negative for polydipsia, polyphagia and polyuria.  Genitourinary: Negative for dysuria and flank pain.  Musculoskeletal: Negative for arthralgias, back pain, joint swelling, myalgias and neck stiffness.  Skin: Negative for color change, rash and wound.  Neurological: Negative for dizziness, tremors, seizures, speech difficulty, weakness, light-headedness and headaches.  Psychiatric/Behavioral: Negative for behavioral problems, confusion, decreased concentration, dysphoric mood and sleep disturbance. The patient is not nervous/anxious.   All other systems reviewed and are negative.   Social History      He  reports that he has been smoking. He has a 5.00 pack-year smoking history. He has never used  smokeless  tobacco. He reports current alcohol use of about 4.0 standard drinks of alcohol per week. He reports current drug use. Drug: Marijuana.       Social History   Socioeconomic History  . Marital status: Single    Spouse name: Not on file  . Number of children: 0  . Years of education: Not on file  . Highest education level: Not on file  Occupational History  . Occupation: Employeed    Comment: self employed  Social Needs  . Financial resource strain: Not on file  . Food insecurity:    Worry: Not on file    Inability: Not on file  . Transportation needs:    Medical: Not on file    Non-medical: Not on file  Tobacco Use  . Smoking status: Current Every Day Smoker    Packs/day: 0.25    Years: 20.00    Pack years: 5.00  . Smokeless tobacco: Never Used  . Tobacco comment: previously smoked 1/2 ppd since age 68.   Substance and Sexual Activity  . Alcohol use: Yes    Alcohol/week: 4.0 standard drinks    Types: 4 Cans of beer per week    Comment: monthly  . Drug use: Yes    Types: Marijuana  . Sexual activity: Not on file  Lifestyle  . Physical activity:    Days per week: Not on file    Minutes per session: Not on file  . Stress: Not on file  Relationships  . Social connections:    Talks on phone: Not on file    Gets together: Not on file    Attends religious service: Not on file    Active member of club or organization: Not on file    Attends meetings of clubs or organizations: Not on file    Relationship status: Not on file  Other Topics Concern  . Not on file  Social History Narrative  . Not on file    History reviewed. No pertinent past medical history.   Patient Active Problem List   Diagnosis Date Noted  . Special screening for malignant neoplasms, colon   . Benign neoplasm of sigmoid colon   . Essential hypertension 05/11/2016  . Chest pain 05/11/2016  . Hyperlipidemia 08/23/2015  . Palpitations 08/23/2015  . Tobacco abuse 08/23/2015    Past  Surgical History:  Procedure Laterality Date  . COLONOSCOPY WITH PROPOFOL N/A 08/14/2016   Procedure: COLONOSCOPY WITH PROPOFOL;  Surgeon: Lucilla Lame, MD;  Location: [facility];  Service: Endoscopy;  Laterality: N/A;  . NO PAST SURGERIES      Family History        Family Status  Relation Name Status  . Father  Alive  . Mother  Deceased       small cell carcinoma  . Sister  Alive        His family history includes Cancer in his mother; Heart disease in his father.      Allergies  Allergen Reactions  . Pravastatin Sodium     Severe muscle cramps     Current Outpatient Medications:  .  aspirin 325 MG tablet, Take 1 tablet by mouth daily., Disp: , Rfl:  .  hydrochlorothiazide (HYDRODIURIL) 12.5 MG tablet, TAKE 1 TABLET (12.5 MG TOTAL) BY MOUTH DAILY., Disp: 90 tablet, Rfl: 3 .  MULTIPLE VITAMIN PO, Take 1 capsule by mouth daily. Reported on 05/11/2016, Disp: , Rfl:    Patient Care Team: Birdie Sons, MD as PCP -  General (Family Medicine)      Objective:   Vitals: BP 120/80 (BP Location: Left Arm, Patient Position: Sitting, Cuff Size: Large)   Pulse 71   Temp 97.8 F (36.6 C) (Oral)   Resp 16   Ht 5' 10.75" (1.797 m)   Wt 166 lb (75.3 kg)   SpO2 97% Comment: room air  BMI 23.32 kg/m       Physical Exam   General Appearance:    Alert, cooperative, no distress, appears stated age  Head:    Normocephalic, without obvious abnormality, atraumatic  Eyes:    PERRL, conjunctiva/corneas clear, EOM's intact, fundi    benign, both eyes       Ears:    Normal TM's and external ear canals, both ears  Nose:   Nares normal, septum midline, mucosa normal, no drainage   or sinus tenderness  Throat:   Lips, mucosa, and tongue normal; teeth and gums normal  Neck:   Supple, symmetrical, trachea midline, no adenopathy;       thyroid:  No enlargement/tenderness/nodules; no carotid   bruit or JVD  Back:     Symmetric, no curvature, ROM normal, no CVA tenderness  Lungs:      Clear to auscultation bilaterally, respirations unlabored  Chest wall:    No tenderness or deformity  Heart:    Regular rate and rhythm, S1 and S2 normal, no murmur, rub   or gallop  Abdomen:     Soft, non-tender, bowel sounds active all four quadrants,    no masses, no organomegaly  Genitalia:    deferred  Rectal:    deferred  Extremities:   Extremities normal, atraumatic, no cyanosis or edema  Pulses:   2+ and symmetric all extremities  Skin:   Skin color, texture, turgor normal, no rashes or lesions  Lymph nodes:   Cervical, supraclavicular, and axillary nodes normal  Neurologic:   CNII-XII intact. Normal strength, sensation and reflexes      throughout    Depression Screen PHQ 2/9 Scores 12/27/2018  PHQ - 2 Score 0  PHQ- 9 Score 2      Assessment & Plan:     Routine Health Maintenance and Physical Exam  Exercise Activities and Dietary recommendations Goals   None     Immunization History  Administered Date(s) Administered  . Influenza,inj,Quad PF,6+ Mos 11/26/2017, 11/25/2018  . Td 11/30/2004  . Tdap 08/12/2011    Health Maintenance  Topic Date Due  . HIV Screening  01/27/1980  . TETANUS/TDAP  08/11/2021  . COLONOSCOPY  08/14/2021  . INFLUENZA VACCINE  Completed     Discussed health benefits of physical activity, and encouraged him to engage in regular exercise appropriate for his age and condition.    -------------------------------------------------------------------- 1. Annual physical exam Normal exam. Generally doing well. He wants to get shingles vaccine after returning from upcoming trip.   2. Ocular migraine He reports has been thoroughly evaluated by ophthalmology and occur less than once a month. No intervention at this time.   3. Essential hypertension Well controlled.  Continue current medications.   - Comprehensive metabolic panel - Lipid panel  4. Prostate cancer screening  - PSA  5. Tobacco abuse < 30 pack year, < 55yo not  candidate for lung cancer screen. Counseled on various health hazards of smoking and encouraged to d/c     Lelon Huh, MD  [facility]

## 2018-12-28 ENCOUNTER — Other Ambulatory Visit: Payer: Self-pay

## 2018-12-28 ENCOUNTER — Other Ambulatory Visit: Payer: Self-pay | Admitting: Family Medicine

## 2018-12-28 LAB — COMPREHENSIVE METABOLIC PANEL
ALBUMIN: 4.3 g/dL (ref 3.8–4.9)
ALK PHOS: 74 IU/L (ref 39–117)
ALT: 17 IU/L (ref 0–44)
AST: 19 IU/L (ref 0–40)
Albumin/Globulin Ratio: 2 (ref 1.2–2.2)
BILIRUBIN TOTAL: 0.3 mg/dL (ref 0.0–1.2)
BUN / CREAT RATIO: 15 (ref 9–20)
BUN: 16 mg/dL (ref 6–24)
CHLORIDE: 100 mmol/L (ref 96–106)
CO2: 24 mmol/L (ref 20–29)
CREATININE: 1.04 mg/dL (ref 0.76–1.27)
Calcium: 9.4 mg/dL (ref 8.7–10.2)
GFR calc non Af Amer: 82 mL/min/{1.73_m2} (ref >59)
GFR, EST AFRICAN AMERICAN: 94 mL/min/{1.73_m2} (ref >59)
GLOBULIN, TOTAL: 2.2 g/dL (ref 1.5–4.5)
Glucose: 85 mg/dL (ref 65–99)
Potassium: 4.2 mmol/L (ref 3.5–5.2)
Sodium: 139 mmol/L (ref 134–144)
Total Protein: 6.5 g/dL (ref 6.0–8.5)

## 2018-12-28 LAB — LIPID PANEL
Chol/HDL Ratio: 5.6 ratio — ABNORMAL HIGH (ref 0.0–5.0)
Cholesterol, Total: 237 mg/dL — ABNORMAL HIGH (ref 100–199)
HDL: 42 mg/dL (ref >39)
LDL Calculated: 164 mg/dL — ABNORMAL HIGH (ref 0–99)
TRIGLYCERIDES: 153 mg/dL — AB (ref 0–149)
VLDL CHOLESTEROL CAL: 31 mg/dL (ref 5–40)

## 2018-12-28 LAB — PSA: PROSTATE SPECIFIC AG, SERUM: 0.4 ng/mL (ref 0.0–4.0)

## 2018-12-28 MED ORDER — EZETIMIBE 10 MG PO TABS: 10.0000 mg | ORAL_TABLET | Freq: Every day | ORAL | 5 refills | Status: DC

## 2019-04-18 ENCOUNTER — Telehealth: Payer: Self-pay | Admitting: Family Medicine

## 2019-04-18 ENCOUNTER — Other Ambulatory Visit: Payer: Self-pay | Admitting: Family Medicine

## 2019-04-18 NOTE — Telephone Encounter (Signed)
Please send Colestid prescription to pharmacy of his choice. Check lipids in 3 months.

## 2019-04-18 NOTE — Telephone Encounter (Signed)
Patient was started on Zetia (ezetemibe) in January for cholesterol. Please check and see if he is having any problems with this medication. If doing well, then need to schedule follow up sometime in the next month and recheck cholesterol.

## 2019-04-18 NOTE — Telephone Encounter (Signed)
-----   Message from Birdie Sons, MD sent at 03/16/2019  8:39 AM EDT ----- Regarding: FW: recheck lipids 2 mos after startin pravastat  ----- Message ----- From: Birdie Sons, MD Sent: 03/16/2019 To: Birdie Sons, MD Subject: FW: recheck lipids 2 mos after startin prava#   ----- Message ----- From: Birdie Sons, MD Sent: 02/20/2019 To: Birdie Sons, MD Subject: recheck lipids 2 mos after startin pravastat

## 2019-04-18 NOTE — Telephone Encounter (Signed)
Pt states he has not started Zetia.  He states it was too expensive.   Is there a cheaper medication he can try?   Thanks,   -Mickel Baas

## 2019-04-19 NOTE — Telephone Encounter (Signed)
Tried calling patient. Left message to call back. 

## 2019-04-25 NOTE — Telephone Encounter (Signed)
Tried calling patient. Left message to call back. 

## 2019-04-26 NOTE — Telephone Encounter (Signed)
Unable to contact the patient. Letter mailed advising to call the office to discuss medication changes.

## 2019-09-08 ENCOUNTER — Other Ambulatory Visit: Payer: Self-pay

## 2019-09-08 DIAGNOSIS — Z20822 Contact with and (suspected) exposure to covid-19: Secondary | ICD-10-CM

## 2019-09-10 LAB — NOVEL CORONAVIRUS, NAA: SARS-CoV-2, NAA: NOT DETECTED

## 2019-10-10 ENCOUNTER — Other Ambulatory Visit: Payer: Self-pay

## 2019-10-10 DIAGNOSIS — Z20822 Contact with and (suspected) exposure to covid-19: Secondary | ICD-10-CM

## 2019-10-12 LAB — NOVEL CORONAVIRUS, NAA: SARS-CoV-2, NAA: NOT DETECTED

## 2020-01-08 ENCOUNTER — Other Ambulatory Visit: Payer: Self-pay | Admitting: Family Medicine

## 2020-01-08 MED ORDER — HYDROCHLOROTHIAZIDE 12.5 MG PO TABS: 12.5000 mg | ORAL_TABLET | Freq: Every day | ORAL | 0 refills | Status: DC

## 2020-01-08 NOTE — Telephone Encounter (Signed)
Requested medication (s) are due for refill today: yes  Requested medication (s) are on the active medication list: yes  Last refill:  12/15/19  Future visit scheduled: no  Notes to clinic:  > 3 months overdue for appointment   Requested Prescriptions  Pending Prescriptions Disp Refills   hydrochlorothiazide (HYDRODIURIL) 12.5 MG tablet 90 tablet 3    Sig: Take 1 tablet (12.5 mg total) by mouth daily.      Cardiovascular: Diuretics - Thiazide Failed - 01/08/2020  1:48 PM      Failed - Ca in normal range and within 360 days    Calcium  Date Value Ref Range Status  12/27/2018 9.4 8.7 - 10.2 mg/dL Final          Failed - Cr in normal range and within 360 days    Creatinine, Ser  Date Value Ref Range Status  12/27/2018 1.04 0.76 - 1.27 mg/dL Final          Failed - K in normal range and within 360 days    Potassium  Date Value Ref Range Status  12/27/2018 4.2 3.5 - 5.2 mmol/L Final          Failed - Na in normal range and within 360 days    Sodium  Date Value Ref Range Status  12/27/2018 139 134 - 144 mmol/L Final          Failed - Valid encounter within last 6 months    Recent Outpatient Visits           1 year ago Annual physical exam   [facility] Birdie Sons, MD   2 years ago Loss of appetite   [facility] Birdie Sons, MD   3 years ago Colon cancer screening   [facility] Birdie Sons, MD   4 years ago Unintentional weight loss   [facility] Birdie Sons, MD              Passed - Last BP in normal range    BP Readings from Last 1 Encounters:  12/27/18 120/80

## 2020-01-08 NOTE — Telephone Encounter (Signed)
Medication Refill - Medication: hydrochlorothiazide (HYDRODIURIL) 12.5 MG    Has the patient contacted their pharmacy? Yes.   (Agent: If no, request that the patient contact the pharmacy for the refill.) (Agent: If yes, when and what did the pharmacy advise?)  Preferred Pharmacy (with phone number or street name):  [id number] [address], [place] - [address]  [place] [address]  Phone: [phone] Fax: [phone]     Agent: Please be advised that RX refills may take up to 3 business days. We ask that you follow-up with your pharmacy.

## 2020-03-27 ENCOUNTER — Encounter: Payer: Self-pay | Admitting: Family Medicine

## 2020-04-01 ENCOUNTER — Other Ambulatory Visit: Payer: Self-pay | Admitting: Family Medicine

## 2020-05-07 NOTE — Progress Notes (Signed)
Complete physical exam   Patient: Martin Holland   DOB: October 31, 1966   54 y.o. Male  MRN: [id number] Visit Date: 05/08/2020  Today's healthcare provider: Lelon Huh, MD   Chief Complaint  Patient presents with  . Annual Exam  . Hypertension  . Hyperlipidemia   Subjective    Martin Holland is a 54 y.o. male who presents today for a complete physical exam.  He reports consuming a unhealthy diet. Home exercise routine includes walking. He generally feels well. He reports sleeping fairly well. He does not have additional problems to discuss today.  HPI  Lipid/Cholesterol, Follow-up  Last lipid panel Other pertinent labs  Lab Results  Component Value Date   CHOL 237 (H) 12/27/2018   HDL 42 12/27/2018   LDLCALC 164 (H) 12/27/2018   TRIG 153 (H) 12/27/2018   CHOLHDL 5.6 (H) 12/27/2018   Lab Results  Component Value Date   ALT 17 12/27/2018   AST 19 12/27/2018   PLT 227 12/01/2017   TSH 1.970 12/01/2017     He was last seen for this 1 year ago.  Management since that visit includes sending prescription of Zetia into the pharmacy. Patient did not start medication due to the cost. Colestid was recommended as a more affordable option, but patient was unable to be reached by telephone to be advised.    He reports good compliance with treatment. He is not having side effects.   Symptoms: No chest pain No chest pressure/discomfort  No dyspnea No lower extremity edema  No numbness or tingling of extremity No orthopnea  No palpitations No paroxysmal nocturnal dyspnea  No speech difficulty No syncope   Current diet: in general, an "unhealthy" diet Current exercise: walking  The 10-year ASCVD risk score Mikey Bussing DC Jr., et al., 2013) is: 22.6%  ---------------------------------------------------------------------------------------------------  Hypertension, follow-up  BP Readings from Last 3 Encounters:  05/08/20 (!) 154/98  12/27/18 120/80  11/26/17 110/84   Wt  Readings from Last 3 Encounters:  05/08/20 167 lb (75.8 kg)  12/27/18 166 lb (75.3 kg)  11/26/17 170 lb (77.1 kg)     He was last seen for hypertension 1 year ago.  BP at that visit was 120/80. Management since that visit includes continue same medication.  He reports good compliance with treatment. He is not having side effects.  He is following a Regular diet. He is exercising. He does smoke.  Use of agents associated with hypertension: none.   Outside blood pressures are checked occasionally. Symptoms: No chest pain No chest pressure  No palpitations No syncope  No dyspnea No orthopnea  No paroxysmal nocturnal dyspnea No lower extremity edema   Pertinent labs: Lab Results  Component Value Date   CHOL 237 (H) 12/27/2018   HDL 42 12/27/2018   LDLCALC 164 (H) 12/27/2018   TRIG 153 (H) 12/27/2018   CHOLHDL 5.6 (H) 12/27/2018   Lab Results  Component Value Date   NA 139 12/27/2018   K 4.2 12/27/2018   CREATININE 1.04 12/27/2018   GFRNONAA 82 12/27/2018   GFRAA 94 12/27/2018   GLUCOSE 85 12/27/2018     The 10-year ASCVD risk score Mikey Bussing DC Jr., et al., 2013) is: 22.6%   ---------------------------------------------------------------------------------------------------  No past medical history on file. Past Surgical History:  Procedure Laterality Date  . COLONOSCOPY WITH PROPOFOL N/A 08/14/2016   Procedure: COLONOSCOPY WITH PROPOFOL;  Surgeon: Lucilla Lame, MD;  Location: [facility];  Service: Endoscopy;  Laterality: N/A;  . NO  PAST SURGERIES     Social History   Socioeconomic History  . Marital status: Single    Spouse name: Not on file  . Number of children: 0  . Years of education: Not on file  . Highest education level: Not on file  Occupational History  . Occupation: Employeed    Comment: self employed  Tobacco Use  . Smoking status: Current Every Day Smoker    Packs/day: 0.50    Years: 20.00    Pack years: 10.00  . Smokeless tobacco: Never  Used  . Tobacco comment: previously smoked 1/2 ppd since age 39.   Substance and Sexual Activity  . Alcohol use: Yes    Alcohol/week: 4.0 standard drinks    Types: 4 Cans of beer per week    Comment: monthly  . Drug use: Yes    Types: Marijuana  . Sexual activity: Not on file  Other Topics Concern  . Not on file  Social History Narrative  . Not on file   Social Determinants of Health   Financial Resource Strain:   . Difficulty of Paying Living Expenses:   Food Insecurity:   . Worried About Charity fundraiser in the Last Year:   . Arboriculturist in the Last Year:   Transportation Needs:   . Film/video editor (Medical):   Marland Kitchen Lack of Transportation (Non-Medical):   Physical Activity:   . Days of Exercise per Week:   . Minutes of Exercise per Session:   Stress:   . Feeling of Stress :   Social Connections:   . Frequency of Communication with Friends and Family:   . Frequency of Social Gatherings with Friends and Family:   . Attends Religious Services:   . Active Member of Clubs or Organizations:   . Attends Archivist Meetings:   Marland Kitchen Marital Status:   Intimate Partner Violence:   . Fear of Current or Ex-Partner:   . Emotionally Abused:   Marland Kitchen Physically Abused:   . Sexually Abused:    Family Status  Relation Name Status  . Father  Alive  . Mother  Deceased       small cell carcinoma  . Sister  Alive   Family History  Problem Relation Age of Onset  . Heart disease Father   . Cancer Mother        small cell carcinoma   Allergies  Allergen Reactions  . Pravastatin Sodium     Severe muscle cramps    Patient Care Team: Birdie Sons, MD as PCP - General (Family Medicine)   Medications: Outpatient Medications Prior to Visit  Medication Sig  . hydrochlorothiazide (HYDRODIURIL) 12.5 MG tablet TAKE 1 TABLET BY MOUTH EVERY DAY  . MULTIPLE VITAMIN PO Take 1 capsule by mouth daily. Reported on 05/11/2016   No facility-administered medications prior  to visit.    Review of Systems  Constitutional: Negative for appetite change, chills, fatigue and fever.  HENT: Negative for congestion, ear pain, hearing loss, nosebleeds and trouble swallowing.   Eyes: Negative for pain and visual disturbance.  Respiratory: Negative for cough, chest tightness and shortness of breath.   Cardiovascular: Negative for chest pain, palpitations and leg swelling.  Gastrointestinal: Negative for abdominal pain, blood in stool, constipation, diarrhea, nausea and vomiting.  Endocrine: Negative for polydipsia, polyphagia and polyuria.  Genitourinary: Negative for dysuria and flank pain.  Musculoskeletal: Negative for arthralgias, back pain, joint swelling, myalgias and neck stiffness.  Skin: Negative for  color change, rash and wound.  Neurological: Negative for dizziness, tremors, seizures, speech difficulty, weakness, light-headedness and headaches.  Psychiatric/Behavioral: Negative for behavioral problems, confusion, decreased concentration, dysphoric mood and sleep disturbance. The patient is not nervous/anxious.   All other systems reviewed and are negative.      Objective    BP (!) 154/98 (BP Location: Right Arm, Cuff Size: Normal)   Pulse 73   Temp (!) 96.9 F (36.1 C) (Temporal)   Resp 16   Ht 5\' 11"  (1.803 m)   Wt 167 lb (75.8 kg)   BMI 23.29 kg/m     Physical Exam   General Appearance:    Well developed, well nourished male. Alert, cooperative, in no acute distress, appears stated age  Head:    Normocephalic, without obvious abnormality, atraumatic  Eyes:    PERRL, conjunctiva/corneas clear, EOM's intact, fundi    benign, both eyes       Ears:    Normal TM's and external ear canals, both ears  Nose:   Nares normal, septum midline, mucosa normal, no drainage   or sinus tenderness  Throat:   Lips, mucosa, and tongue normal; teeth and gums normal  Neck:   Supple, symmetrical, trachea midline, no adenopathy;       thyroid:  No  enlargement/tenderness/nodules; no carotid   bruit or JVD  Back:     Symmetric, no curvature, ROM normal, no CVA tenderness  Lungs:     Clear to auscultation bilaterally, respirations unlabored  Chest wall:    No tenderness or deformity  Heart:    Normal heart rate. Normal rhythm. No murmurs, rubs, or gallops.  S1 and S2 normal  Abdomen:     Soft, non-tender, bowel sounds active all four quadrants,    no masses, no organomegaly  Genitalia:    deferred  Rectal:    deferred  Extremities:   All extremities are intact. No cyanosis or edema  Pulses:   2+ and symmetric all extremities  Skin:   Skin color, texture, turgor normal, no rashes or lesions. Several small lipomas noted abdominal wall and UEs.   Lymph nodes:   Cervical, supraclavicular, and axillary nodes normal  Neurologic:   CNII-XII intact. Normal strength, sensation and reflexes      throughout     Depression Screen  PHQ 2/9 Scores 05/08/2020 12/27/2018  PHQ - 2 Score 0 0  PHQ- 9 Score - 2    No results found for any visits on 05/08/20.  Assessment & Plan    Routine Health Maintenance and Physical Exam  Exercise Activities and Dietary recommendations Goals   None     Immunization History  Administered Date(s) Administered  . Influenza,inj,Quad PF,6+ Mos 11/26/2017, 11/25/2018  . Td 11/30/2004  . Tdap 08/12/2011    Health Maintenance  Topic Date Due  . Hepatitis C Screening  Never done  . COVID-19 Vaccine (1) Never done  . HIV Screening  Never done  . INFLUENZA VACCINE  06/30/2020  . TETANUS/TDAP  08/11/2021  . COLONOSCOPY  08/14/2021    Discussed health benefits of physical activity, and encouraged him to engage in regular exercise appropriate for his age and condition.  1. Annual physical exam  - EKG 12-Lead - CBC - Lipid panel - Hepatitis C antibody - HIV Antibody (routine testing w rflx)  2. Essential hypertension Uncontrolled. Will likely increase hctz or add another medication after reviewing  labs.  - Comprehensive metabolic panel - CBC - Lipid panel  3. Hyperlipidemia,  unspecified hyperlipidemia type Diet controlled.  - Comprehensive metabolic panel - CBC - Lipid panel  4. Lipoma, unspecified site Benign.   5. Prostate cancer screening  - PSA Total (Reflex To Free) ([facility] only)       The entirety of the information documented in the History of Present Illness, Review of Systems and Physical Exam were personally obtained by me. Portions of this information were initially documented by the CMA and reviewed by me for thoroughness and accuracy.      Lelon Huh, MD  [facility] [phone] (phone) [phone] (fax)  [place]

## 2020-05-08 ENCOUNTER — Other Ambulatory Visit: Payer: Self-pay

## 2020-05-08 ENCOUNTER — Encounter: Payer: Self-pay | Admitting: Family Medicine

## 2020-05-08 ENCOUNTER — Ambulatory Visit (INDEPENDENT_AMBULATORY_CARE_PROVIDER_SITE_OTHER): Payer: Managed Care, Other (non HMO) | Admitting: Family Medicine

## 2020-05-08 VITALS — BP 154/98 | HR 73 | Temp 96.9°F | Resp 16 | Ht 71.0 in | Wt 167.0 lb

## 2020-05-08 DIAGNOSIS — Z136 Encounter for screening for cardiovascular disorders: Secondary | ICD-10-CM

## 2020-05-08 DIAGNOSIS — I10 Essential (primary) hypertension: Secondary | ICD-10-CM | POA: Diagnosis not present

## 2020-05-08 DIAGNOSIS — Z0000 Encounter for general adult medical examination without abnormal findings: Secondary | ICD-10-CM

## 2020-05-08 DIAGNOSIS — Z125 Encounter for screening for malignant neoplasm of prostate: Secondary | ICD-10-CM

## 2020-05-08 DIAGNOSIS — D179 Benign lipomatous neoplasm, unspecified: Secondary | ICD-10-CM | POA: Diagnosis not present

## 2020-05-08 DIAGNOSIS — E785 Hyperlipidemia, unspecified: Secondary | ICD-10-CM

## 2020-05-08 DIAGNOSIS — Z114 Encounter for screening for human immunodeficiency virus [HIV]: Secondary | ICD-10-CM

## 2020-05-08 DIAGNOSIS — Z1159 Encounter for screening for other viral diseases: Secondary | ICD-10-CM

## 2020-05-08 NOTE — Patient Instructions (Signed)
. Please review the attached list of medications and notify my office if there are any errors.   . Please bring all of your medications to every appointment so we can make sure that our medication list is the same as yours.    DASH Eating Plan DASH stands for "Dietary Approaches to Stop Hypertension." The DASH eating plan is a healthy eating plan that has been shown to reduce high blood pressure (hypertension). It may also reduce your risk for type 2 diabetes, heart disease, and stroke. The DASH eating plan may also help with weight loss. What are tips for following this plan?  General guidelines  Avoid eating more than 2,300 mg (milligrams) of salt (sodium) a day. If you have hypertension, you may need to reduce your sodium intake to 1,500 mg a day.  Limit alcohol intake to no more than 1 drink a day for nonpregnant women and 2 drinks a day for men. One drink equals 12 oz of beer, 5 oz of wine, or 1 oz of hard liquor.  Work with your health care provider to maintain a healthy body weight or to lose weight. Ask what an ideal weight is for you.  Get at least 30 minutes of exercise that causes your heart to beat faster (aerobic exercise) most days of the week. Activities may include walking, swimming, or biking.  Work with your health care provider or diet and nutrition specialist (dietitian) to adjust your eating plan to your individual calorie needs. Reading food labels   Check food labels for the amount of sodium per serving. Choose foods with less than 5 percent of the Daily Value of sodium. Generally, foods with less than 300 mg of sodium per serving fit into this eating plan.  To find whole grains, look for the word "whole" as the first word in the ingredient list. Shopping  Buy products labeled as "low-sodium" or "no salt added."  Buy fresh foods. Avoid canned foods and premade or frozen meals. Cooking  Avoid adding salt when cooking. Use salt-free seasonings or herbs instead  of table salt or sea salt. Check with your health care provider or pharmacist before using salt substitutes.  Do not fry foods. Cook foods using healthy methods such as baking, boiling, grilling, and broiling instead.  Cook with heart-healthy oils, such as olive, canola, soybean, or sunflower oil. Meal planning  Eat a balanced diet that includes: ? 5 or more servings of fruits and vegetables each day. At each meal, try to fill half of your plate with fruits and vegetables. ? Up to 6-8 servings of whole grains each day. ? Less than 6 oz of lean meat, poultry, or fish each day. A 3-oz serving of meat is about the same size as a deck of cards. One egg equals 1 oz. ? 2 servings of low-fat dairy each day. ? A serving of nuts, seeds, or beans 5 times each week. ? Heart-healthy fats. Healthy fats called Omega-3 fatty acids are found in foods such as flaxseeds and coldwater fish, like sardines, salmon, and mackerel.  Limit how much you eat of the following: ? Canned or prepackaged foods. ? Food that is high in trans fat, such as fried foods. ? Food that is high in saturated fat, such as fatty meat. ? Sweets, desserts, sugary drinks, and other foods with added sugar. ? Full-fat dairy products.  Do not salt foods before eating.  Try to eat at least 2 vegetarian meals each week.  Eat more home-cooked  food and less restaurant, buffet, and fast food.  When eating at a restaurant, ask that your food be prepared with less salt or no salt, if possible. What foods are recommended? The items listed may not be a complete list. Talk with your dietitian about what dietary choices are best for you. Grains Whole-grain or whole-wheat bread. Whole-grain or whole-wheat pasta. Brown rice. Modena [place]row. Bulgur. Whole-grain and low-sodium cereals. Pita bread. Low-fat, low-sodium crackers. Whole-wheat flour tortillas. Vegetables Fresh or frozen vegetables (raw, steamed, roasted, or grilled). Low-sodium or  reduced-sodium tomato and vegetable juice. Low-sodium or reduced-sodium tomato sauce and tomato paste. Low-sodium or reduced-sodium canned vegetables. Fruits All fresh, dried, or frozen fruit. Canned fruit in natural juice (without added sugar). Meat and other protein foods Skinless chicken or [place]. Ground chicken or [place]. Pork with fat trimmed off. Fish and seafood. Egg whites. Dried beans, peas, or lentils. Unsalted nuts, nut butters, and seeds. Unsalted canned beans. Lean cuts of beef with fat trimmed off. Low-sodium, lean deli meat. Dairy Low-fat (1%) or fat-free (skim) milk. Fat-free, low-fat, or reduced-fat cheeses. Nonfat, low-sodium ricotta or cottage cheese. Low-fat or nonfat yogurt. Low-fat, low-sodium cheese. Fats and oils Soft margarine without trans fats. Vegetable oil. Low-fat, reduced-fat, or light mayonnaise and salad dressings (reduced-sodium). Canola, safflower, olive, soybean, and sunflower oils. Avocado. Seasoning and other foods Herbs. Spices. Seasoning mixes without salt. Unsalted popcorn and pretzels. Fat-free sweets. What foods are not recommended? The items listed may not be a complete list. Talk with your dietitian about what dietary choices are best for you. Grains Baked goods made with fat, such as croissants, muffins, or some breads. Dry pasta or rice meal packs. Vegetables Creamed or fried vegetables. Vegetables in a cheese sauce. Regular canned vegetables (not low-sodium or reduced-sodium). Regular canned tomato sauce and paste (not low-sodium or reduced-sodium). Regular tomato and vegetable juice (not low-sodium or reduced-sodium). Angie Fava. Olives. Fruits Canned fruit in a light or heavy syrup. Fried fruit. Fruit in cream or butter sauce. Meat and other protein foods Fatty cuts of meat. Ribs. Fried meat. Berniece Salines. Sausage. Bologna and other processed lunch meats. Salami. Fatback. Hotdogs. Bratwurst. Salted nuts and seeds. Canned beans with added salt. Canned or  smoked fish. Whole eggs or egg yolks. Chicken or [place] with skin. Dairy Whole or 2% milk, cream, and half-and-half. Whole or full-fat cream cheese. Whole-fat or sweetened yogurt. Full-fat cheese. Nondairy creamers. Whipped toppings. Processed cheese and cheese spreads. Fats and oils Butter. Stick margarine. Lard. Shortening. Ghee. Bacon fat. Tropical oils, such as coconut, palm kernel, or palm oil. Seasoning and other foods Salted popcorn and pretzels. Onion salt, garlic salt, seasoned salt, table salt, and sea salt. Worcestershire sauce. Tartar sauce. Barbecue sauce. Teriyaki sauce. Soy sauce, including reduced-sodium. Steak sauce. Canned and packaged gravies. Fish sauce. Oyster sauce. Cocktail sauce. Horseradish that you find on the shelf. Ketchup. Mustard. Meat flavorings and tenderizers. Bouillon cubes. Hot sauce and Tabasco sauce. Premade or packaged marinades. Premade or packaged taco seasonings. Relishes. Regular salad dressings. Where to find more information:  National Heart, Lung, and [place]: https://wilson-eaton.com/  American Heart Association: www.heart.org Summary  The DASH eating plan is a healthy eating plan that has been shown to reduce high blood pressure (hypertension). It may also reduce your risk for type 2 diabetes, heart disease, and stroke.  With the DASH eating plan, you should limit salt (sodium) intake to 2,300 mg a day. If you have hypertension, you may need to reduce your sodium intake to 1,500 mg  a day.  When on the DASH eating plan, aim to eat more fresh fruits and vegetables, whole grains, lean proteins, low-fat dairy, and heart-healthy fats.  Work with your health care provider or diet and nutrition specialist (dietitian) to adjust your eating plan to your individual calorie needs. This information is not intended to replace advice given to you by your health care provider. Make sure you discuss any questions you have with your health care provider. Document  Revised: 10/29/2017 Document Reviewed: 11/09/2016 Elsevier Patient Education  2020 [employer].

## 2020-05-09 ENCOUNTER — Telehealth: Payer: Self-pay

## 2020-05-09 LAB — COMPREHENSIVE METABOLIC PANEL
ALT: 16 IU/L (ref 0–44)
AST: 23 IU/L (ref 0–40)
Albumin/Globulin Ratio: 1.8 (ref 1.2–2.2)
Albumin: 4.5 g/dL (ref 3.8–4.9)
Alkaline Phosphatase: 86 IU/L (ref 48–121)
BUN/Creatinine Ratio: 16 (ref 9–20)
BUN: 16 mg/dL (ref 6–24)
Bilirubin Total: 0.3 mg/dL (ref 0.0–1.2)
CO2: 21 mmol/L (ref 20–29)
Calcium: 9.6 mg/dL (ref 8.7–10.2)
Chloride: 102 mmol/L (ref 96–106)
Creatinine, Ser: 1.03 mg/dL (ref 0.76–1.27)
GFR calc Af Amer: 94 mL/min/{1.73_m2} (ref >59)
GFR calc non Af Amer: 81 mL/min/{1.73_m2} (ref >59)
Globulin, Total: 2.5 g/dL (ref 1.5–4.5)
Glucose: 91 mg/dL (ref 65–99)
Potassium: 4.3 mmol/L (ref 3.5–5.2)
Sodium: 138 mmol/L (ref 134–144)
Total Protein: 7 g/dL (ref 6.0–8.5)

## 2020-05-09 LAB — HEPATITIS C ANTIBODY: Hep C Virus Ab: <0.1 s/co ratio (ref 0.0–0.9)

## 2020-05-09 LAB — CBC
Hematocrit: 45.9 % (ref 37.5–51.0)
Hemoglobin: 15.9 g/dL (ref 13.0–17.7)
MCH: 30.2 pg (ref 26.6–33.0)
MCHC: 34.6 g/dL (ref 31.5–35.7)
MCV: 87 fL (ref 79–97)
Platelets: 217 10*3/uL (ref 150–450)
RBC: 5.27 x10E6/uL (ref 4.14–5.80)
RDW: 12.2 % (ref 11.6–15.4)
WBC: 7.8 10*3/uL (ref 3.4–10.8)

## 2020-05-09 LAB — LIPID PANEL
Chol/HDL Ratio: 6 ratio — ABNORMAL HIGH (ref 0.0–5.0)
Cholesterol, Total: 270 mg/dL — ABNORMAL HIGH (ref 100–199)
HDL: 45 mg/dL (ref >39)
LDL Chol Calc (NIH): 200 mg/dL — ABNORMAL HIGH (ref 0–99)
Triglycerides: 135 mg/dL (ref 0–149)
VLDL Cholesterol Cal: 25 mg/dL (ref 5–40)

## 2020-05-09 LAB — PSA TOTAL (REFLEX TO FREE): Prostate Specific Ag, Serum: 0.4 ng/mL (ref 0.0–4.0)

## 2020-05-09 LAB — HIV ANTIBODY (ROUTINE TESTING W REFLEX): HIV Screen 4th Generation wRfx: NONREACTIVE

## 2020-05-09 MED ORDER — ROSUVASTATIN CALCIUM 10 MG PO TABS: 10.0000 mg | ORAL_TABLET | Freq: Every day | ORAL | 2 refills | Status: DC

## 2020-05-09 MED ORDER — VALSARTAN-HYDROCHLOROTHIAZIDE 80-12.5 MG PO TABS
1.0000 | ORAL_TABLET | Freq: Every day | ORAL | 2 refills | Status: DC
Start: 2020-05-09 — End: 2020-08-07

## 2020-05-09 NOTE — Telephone Encounter (Signed)
-----   Message from Birdie Sons, MD sent at 05/09/2020  8:18 AM EDT ----- Cholesterol is very high which causes vascular disease. Need to try another cholesterol medication. Start rosuvastatin 10mg  once a day, #30 rf x 2. Also recommend taking OTC Coenzyme Q10 200mg  once a day to prevent or reduce side effects.  Also need to change hctz to valsartan-hctz 80-12.5 one tablet daily #30, rf x 2 to get blood pressure down.  Schedule follow up office visit to check BP and labs in 4-6 weeks.

## 2020-05-09 NOTE — Telephone Encounter (Signed)
Patient advised. RX sent to pharmacy. Follow up scheduled.  

## 2020-06-18 NOTE — Progress Notes (Deleted)
Established patient visit   Patient: Martin Holland   DOB: 03-11-1966   54 y.o. Male  MRN: [id number] Visit Date: 06/19/2020  Today's healthcare provider: Lelon Huh, MD   No chief complaint on file.  Subjective    HPI  Hypertension, follow-up  BP Readings from Last 3 Encounters:  05/08/20 (!) 154/98  12/27/18 120/80  11/26/17 110/84   Wt Readings from Last 3 Encounters:  05/08/20 167 lb (75.8 kg)  12/27/18 166 lb (75.3 kg)  11/26/17 170 lb (77.1 kg)     He was last seen for hypertension 5 weeks ago.  BP at that visit was 154/98. Management since that visit includes check labs, change HTCZ to Valsartan-HTCZ 80-12.5 mg daily.  He reports {excellent/good/fair/poor:19665} compliance with treatment. He {is/is not:9024} having side effects. {document side effects if present:1} He is following a {diet:21022986} diet. He {is/is not:9024} exercising. He {does/does not:200015} smoke.  Use of agents associated with hypertension: {bp agents assoc with hypertension:511::"none"}.   Outside blood pressures are {***enter patient reported home BP readings, or 'not being checked':1}. Symptoms: {Yes/No:20286} chest pain {Yes/No:20286} chest pressure  {Yes/No:20286} palpitations {Yes/No:20286} syncope  {Yes/No:20286} dyspnea {Yes/No:20286} orthopnea  {Yes/No:20286} paroxysmal nocturnal dyspnea {Yes/No:20286} lower extremity edema   Pertinent labs: Lab Results  Component Value Date   CHOL 270 (H) 05/08/2020   HDL 45 05/08/2020   LDLCALC 200 (H) 05/08/2020   TRIG 135 05/08/2020   CHOLHDL 6.0 (H) 05/08/2020   Lab Results  Component Value Date   NA 138 05/08/2020   K 4.3 05/08/2020   CREATININE 1.03 05/08/2020   GFRNONAA 81 05/08/2020   GFRAA 94 05/08/2020   GLUCOSE 91 05/08/2020     The 10-year ASCVD risk score Mikey Bussing DC Jr., et al., 2013) is: 24.5%    --------------------------------------------------------------------------------------------------- Lipid/Cholesterol, Follow-up  Last lipid panel Other pertinent labs  Lab Results  Component Value Date   CHOL 270 (H) 05/08/2020   HDL 45 05/08/2020   LDLCALC 200 (H) 05/08/2020   TRIG 135 05/08/2020   CHOLHDL 6.0 (H) 05/08/2020   Lab Results  Component Value Date   ALT 16 05/08/2020   AST 23 05/08/2020   PLT 217 05/08/2020   TSH 1.970 12/01/2017     He was last seen for this 6 weeks ago.  Management since that visit includes check labs, D/C Zetia and start Rosuvastatin 10 mg daily. Take OTC Coenzyme Q10 200 mg daily.  He reports {excellent/good/fair/poor:19665} compliance with treatment. He {is/is not:9024} having side effects. {document side effects if present:1}  Symptoms: {Yes/No:20286} chest pain {Yes/No:20286} chest pressure/discomfort  {Yes/No:20286} dyspnea {Yes/No:20286} lower extremity edema  {Yes/No:20286} numbness or tingling of extremity {Yes/No:20286} orthopnea  {Yes/No:20286} palpitations {Yes/No:20286} paroxysmal nocturnal dyspnea  {Yes/No:20286} speech difficulty {Yes/No:20286} syncope   Current diet: {diet habits:16563} Current exercise: {exercise types:16438}  The 10-year ASCVD risk score Mikey Bussing DC Jr., et al., 2013) is: 24.5%  ---------------------------------------------------------------------------------------------------   {Show patient history (optional):23778::" "}   Medications: Outpatient Medications Prior to Visit  Medication Sig  . MULTIPLE VITAMIN PO Take 1 capsule by mouth daily. Reported on 05/11/2016  . rosuvastatin (CRESTOR) 10 MG tablet Take 1 tablet (10 mg total) by mouth daily.  . valsartan-hydrochlorothiazide (DIOVAN HCT) 80-12.5 MG tablet Take 1 tablet by mouth daily.   No facility-administered medications prior to visit.    Review of Systems  {Heme  Chem  Endocrine  Serology  Results Review (optional):23779::" "}   Objective    There were no vitals taken for this  visit. {Show previous vital signs (optional):23777::" "}  Physical Exam  ***  No results found for any visits on 06/19/20.  Assessment & Plan     ***  No follow-ups on file.      {provider attestation***:1}   Lelon Huh, MD  [facility] [phone] (phone) [phone] (fax)  [place]

## 2020-06-19 ENCOUNTER — Ambulatory Visit: Payer: Managed Care, Other (non HMO) | Admitting: Family Medicine

## 2020-07-01 ENCOUNTER — Other Ambulatory Visit: Payer: Self-pay | Admitting: Family Medicine

## 2020-08-07 ENCOUNTER — Telehealth: Payer: Self-pay | Admitting: Physician Assistant

## 2020-08-07 ENCOUNTER — Other Ambulatory Visit: Payer: Self-pay | Admitting: Physician Assistant

## 2020-08-07 ENCOUNTER — Other Ambulatory Visit: Payer: Self-pay | Admitting: Family Medicine

## 2020-08-07 ENCOUNTER — Encounter: Payer: Self-pay | Admitting: Physician Assistant

## 2020-08-07 ENCOUNTER — Telehealth: Payer: Self-pay | Admitting: Family Medicine

## 2020-08-07 DIAGNOSIS — U071 COVID-19: Secondary | ICD-10-CM

## 2020-08-07 DIAGNOSIS — I10 Essential (primary) hypertension: Secondary | ICD-10-CM | POA: Insufficient documentation

## 2020-08-07 DIAGNOSIS — Z720 Tobacco use: Secondary | ICD-10-CM

## 2020-08-07 NOTE — Telephone Encounter (Signed)
Pt is calling to let dr Caryn Section know he had rapid covid test yesterday and received his result today. Pt is positive for covid. Pt has low temp 98.2. Pt would like to know if he qualify for covid infusion. Pt is not having any other symptoms

## 2020-08-07 NOTE — Telephone Encounter (Signed)
Called to discuss with patient about Covid symptoms and the use of casirivimab/imdevimab, a monoclonal antibody infusion for those with mild to moderate Covid symptoms and at a high risk of hospitalization.  Pt is qualified for this infusion at the [facility] infusion center due to; Specific high risk criteria : Cardiovascular disease or hypertension and tobacco abuse.   Message left to call back our hotline [phone]. I also sent him a Mychart message.   Angelena Form PA-C  [facility]

## 2020-08-07 NOTE — Telephone Encounter (Signed)
Please place orders per Dr.Gilbert. Caryn Section is out of the office until Friday. I don't have the information on how to send for referral.

## 2020-08-07 NOTE — Telephone Encounter (Signed)
Requested Prescriptions  Pending Prescriptions Disp Refills  . valsartan-hydrochlorothiazide (DIOVAN-HCT) 80-12.5 MG tablet [Pharmacy Med Name: VALSARTAN-HCTZ 80-12.5 MG TAB] 90 tablet     Sig: TAKE 1 TABLET BY MOUTH EVERY DAY     Cardiovascular: ARB + Diuretic Combos Failed - 08/07/2020  1:18 AM      Failed - Last BP in normal range    BP Readings from Last 1 Encounters:  05/08/20 (!) 154/98         Passed - K in normal range and within 180 days    Potassium  Date Value Ref Range Status  05/08/2020 4.3 3.5 - 5.2 mmol/L Final         Passed - Na in normal range and within 180 days    Sodium  Date Value Ref Range Status  05/08/2020 138 134 - 144 mmol/L Final         Passed - Cr in normal range and within 180 days    Creatinine, Ser  Date Value Ref Range Status  05/08/2020 1.03 0.76 - 1.27 mg/dL Final         Passed - Ca in normal range and within 180 days    Calcium  Date Value Ref Range Status  05/08/2020 9.6 8.7 - 10.2 mg/dL Final         Passed - Patient is not pregnant      Passed - Valid encounter within last 6 months    Recent Outpatient Visits          3 months ago Annual physical exam   [facility] Birdie Sons, MD   1 year ago Annual physical exam   [facility] Birdie Sons, MD   2 years ago Loss of appetite   [facility] Birdie Sons, MD   4 years ago Colon cancer screening   [facility] Birdie Sons, MD   4 years ago Unintentional weight loss   [facility] Birdie Sons, MD             . rosuvastatin (CRESTOR) 10 MG tablet [Pharmacy Med Name: ROSUVASTATIN CALCIUM 10 MG TAB] 90 tablet     Sig: TAKE 1 TABLET BY MOUTH EVERY DAY     Cardiovascular:  Antilipid - Statins Failed - 08/07/2020  1:18 AM      Failed - Total Cholesterol in normal range and within 360 days    Cholesterol, Total  Date Value Ref Range Status  05/08/2020 270 (H) 100 - 199 mg/dL Final          Failed - LDL in normal range and within 360 days    LDL Chol Calc (NIH)  Date Value Ref Range Status  05/08/2020 200 (H) 0 - 99 mg/dL Final         Passed - HDL in normal range and within 360 days    HDL  Date Value Ref Range Status  05/08/2020 45 >39 mg/dL Final         Passed - Triglycerides in normal range and within 360 days    Triglycerides  Date Value Ref Range Status  05/08/2020 135 0 - 149 mg/dL Final         Passed - Patient is not pregnant      Passed - Valid encounter within last 12 months    Recent Outpatient Visits          3 months ago Annual physical exam   [facility]  Birdie Sons, MD   1 year ago Annual physical exam   [facility] Birdie Sons, MD   2 years ago Loss of appetite   [facility] Birdie Sons, MD   4 years ago Colon cancer screening   [facility] Birdie Sons, MD   4 years ago Unintentional weight loss   [facility] Birdie Sons, MD

## 2020-08-07 NOTE — Progress Notes (Signed)
I connected by phone with Tonia Brooms on 08/07/2020 at 5:36 PM to discuss the potential use of a new treatment for mild to moderate COVID-19 viral infection in non-hospitalized patients.  This patient is a 54 y.o. male that meets the FDA criteria for Emergency Use Authorization of COVID monoclonal antibody casirivimab/imdevimab.  Has a (+) direct SARS-CoV-2 viral test result  Has mild or moderate COVID-19   Is NOT hospitalized due to COVID-19  Is within 10 days of symptom onset  Has at least one of the high risk factor(s) for progression to severe COVID-19 and/or hospitalization as defined in EUA.  Specific high risk criteria : Cardiovascular disease or hypertension and Other high risk medical condition per CDC:  tobacco abuse   I have spoken and communicated the following to the patient or parent/caregiver regarding COVID monoclonal antibody treatment:  1. FDA has authorized the emergency use for the treatment of mild to moderate COVID-19 in adults and pediatric patients with positive results of direct SARS-CoV-2 viral testing who are 68 years of age and older weighing at least 40 kg, and who are at high risk for progressing to severe COVID-19 and/or hospitalization.  2. The significant known and potential risks and benefits of COVID monoclonal antibody, and the extent to which such potential risks and benefits are unknown.  3. Information on available alternative treatments and the risks and benefits of those alternatives, including clinical trials.  4. Patients treated with COVID monoclonal antibody should continue to self-isolate and use infection control measures (e.g., wear mask, isolate, social distance, avoid sharing personal items, clean and disinfect "high touch" surfaces, and frequent handwashing) according to CDC guidelines.   5. The patient or parent/caregiver has the option to accept or refuse COVID monoclonal antibody treatment.  After reviewing this information with the  patient, The patient agreed to proceed with receiving casirivimab\imdevimab infusion and will be provided a copy of the Fact sheet prior to receiving the infusion.  Sx onset 9/7. Set up for infusion on 08/07/20. Directions given to [facility]. Pt is aware that insurance will be charged an infusion fee. Pt is unvaccinated.   Angelena Form 08/07/2020 5:36 PM

## 2020-08-07 NOTE — Telephone Encounter (Signed)
Done

## 2020-08-07 NOTE — Telephone Encounter (Signed)
Please send in to [facility] for referral and they will reach out to him

## 2020-08-08 ENCOUNTER — Other Ambulatory Visit (HOSPITAL_COMMUNITY): Payer: Self-pay

## 2020-08-08 ENCOUNTER — Ambulatory Visit (HOSPITAL_COMMUNITY)
Admission: RE | Admit: 2020-08-08 | Discharge: 2020-08-08 | Disposition: A | Discharge status: Home / Self Care | Payer: BLUE CROSS/BLUE SHIELD | Source: Ambulatory Visit | Attending: Pulmonary Disease | Admitting: Pulmonary Disease

## 2020-08-08 DIAGNOSIS — Z720 Tobacco use: Secondary | ICD-10-CM

## 2020-08-08 DIAGNOSIS — U071 COVID-19: Secondary | ICD-10-CM

## 2020-08-08 DIAGNOSIS — I10 Essential (primary) hypertension: Secondary | ICD-10-CM | POA: Diagnosis not present

## 2020-08-08 MED ORDER — EPINEPHRINE 0.3 MG/0.3ML IJ SOAJ: 0.3000 mg | Freq: Once | INTRAMUSCULAR | Status: DC | PRN

## 2020-08-08 MED ORDER — SODIUM CHLORIDE 0.9 % IV SOLN: INTRAVENOUS | Status: DC | PRN

## 2020-08-08 MED ORDER — ALBUTEROL SULFATE HFA 108 (90 BASE) MCG/ACT IN AERS: 2.0000 | INHALATION_SPRAY | Freq: Once | RESPIRATORY_TRACT | Status: DC | PRN

## 2020-08-08 MED ORDER — SODIUM CHLORIDE 0.9 % IV SOLN
1200.0000 mg | Freq: Once | INTRAVENOUS | Status: AC
  Administered 2020-08-08: 1200 mg via INTRAVENOUS
  Filled 2020-08-08: qty 10

## 2020-08-08 MED ORDER — METHYLPREDNISOLONE SODIUM SUCC 125 MG IJ SOLR: 125.0000 mg | Freq: Once | INTRAMUSCULAR | Status: DC | PRN

## 2020-08-08 MED ORDER — DIPHENHYDRAMINE HCL 50 MG/ML IJ SOLN: 50.0000 mg | Freq: Once | INTRAMUSCULAR | Status: DC | PRN

## 2020-08-08 MED ORDER — FAMOTIDINE IN NACL 20-0.9 MG/50ML-% IV SOLN: 20.0000 mg | Freq: Once | INTRAVENOUS | Status: DC | PRN

## 2020-08-08 NOTE — Progress Notes (Signed)
  Diagnosis: COVID-19  Physician: Dr. Asencion Noble  Procedure: [facility] Med: bamlanivimab\etesevimab infusion - Provided patient with bamlanimivab\etesevimab fact sheet for patients, parents and caregivers prior to infusion.  Complications: No immediate complications noted.  Discharge: Discharged home   Gaye Alken 08/08/2020

## 2020-08-08 NOTE — Discharge Instructions (Signed)

## 2020-08-12 ENCOUNTER — Encounter: Payer: Self-pay | Admitting: Family Medicine

## 2020-08-12 DIAGNOSIS — I10 Essential (primary) hypertension: Secondary | ICD-10-CM

## 2020-08-13 ENCOUNTER — Encounter: Payer: Self-pay | Admitting: Family Medicine

## 2020-08-13 ENCOUNTER — Other Ambulatory Visit: Payer: Self-pay | Admitting: Family Medicine

## 2020-08-13 DIAGNOSIS — I10 Essential (primary) hypertension: Secondary | ICD-10-CM

## 2020-08-13 MED ORDER — AMLODIPINE BESYLATE 2.5 MG PO TABS: 2.5000 mg | ORAL_TABLET | Freq: Every day | ORAL | 0 refills | Status: DC

## 2020-08-13 MED ORDER — HYDROCHLOROTHIAZIDE 12.5 MG PO CAPS: 12.5000 mg | ORAL_CAPSULE | Freq: Every day | ORAL | Status: DC

## 2020-08-13 NOTE — Addendum Note (Signed)
Addended by: Lelon Huh E on: 08/13/2020 10:10 AM   Modules accepted: Orders

## 2020-11-09 ENCOUNTER — Encounter: Payer: Self-pay | Admitting: Family Medicine

## 2020-11-09 DIAGNOSIS — I10 Essential (primary) hypertension: Secondary | ICD-10-CM

## 2020-11-11 MED ORDER — HYDROCHLOROTHIAZIDE 12.5 MG PO CAPS: 12.5000 mg | ORAL_CAPSULE | Freq: Every day | ORAL | 3 refills | Status: DC

## 2020-11-13 ENCOUNTER — Other Ambulatory Visit: Payer: Self-pay | Admitting: Family Medicine

## 2020-11-13 DIAGNOSIS — I10 Essential (primary) hypertension: Secondary | ICD-10-CM

## 2021-01-27 ENCOUNTER — Other Ambulatory Visit: Payer: Self-pay

## 2021-01-27 ENCOUNTER — Ambulatory Visit: Payer: Managed Care, Other (non HMO) | Admitting: Family Medicine

## 2021-01-27 VITALS — BP 165/104 | HR 81 | Temp 97.8°F | Wt 161.0 lb

## 2021-01-27 DIAGNOSIS — E785 Hyperlipidemia, unspecified: Secondary | ICD-10-CM

## 2021-01-27 DIAGNOSIS — R1011 Right upper quadrant pain: Secondary | ICD-10-CM | POA: Diagnosis not present

## 2021-01-27 DIAGNOSIS — I10 Essential (primary) hypertension: Secondary | ICD-10-CM

## 2021-01-27 NOTE — Progress Notes (Signed)
Established patient visit   Patient: Martin Holland   DOB: 1966-01-07   55 y.o. Male  MRN: [id number] Visit Date: 01/27/2021  Today's healthcare provider: Lelon Huh, MD   No chief complaint on file.  Subjective    HPI  Abdominal Pain  He reports recurrent abdominal pain. The most recent episode started 8 days ago and is improving. The abdominal pain is located in the right upper quadrant and does not radiate and  with radiation to the epigastrium. It is described as catching feeling, is mild in intensity, occurring constantly. It is aggravated by certain movements and is relieved by eating. He has tried NSAIDs with significant relief. He tends to go long periods without eating and had lost his appetite for a few day, but states over the last few days he has gotten himself on a strict scheduled of eating small meals every 3-4 hours and pain has no completely resolved.  He states that prior to onset of RUQ pain he had episode of severe right sided back pain and was about to go to ER, but he took 4 ibuprofen after which the back pain resolved and has not returned.   He also has high blood pressure and reports he stopped taking amlodipine because it was upsetting his stomach. He reports home blood pressures are usually in the low 140s.   He stopped taking rosuvastatin because he started having frequent ocular migraines while he was taking it, which have gone away since he stopped taking it.    Associated symptoms: Yes anorexia  No belching  No bloody stool No blood in urine   Yes constipation No diarrhea  No dysuria No fever  No flatus Yes headaches  Yes headaches No joint pains  No myalgias Yes nausea  No vomiting Yes weight loss     Recent GI studies:colonoscopy - 08/14/2016   Previous labs Lab Results  Component Value Date   WBC 7.8 05/08/2020   HGB 15.9 05/08/2020   HCT 45.9 05/08/2020   MCV 87 05/08/2020   MCH 30.2 05/08/2020   RDW 12.2 05/08/2020   PLT 217  05/08/2020   Lab Results  Component Value Date   GLUCOSE 91 05/08/2020   NA 138 05/08/2020   K 4.3 05/08/2020   CL 102 05/08/2020   CO2 21 05/08/2020   BUN 16 05/08/2020   CREATININE 1.03 05/08/2020   GFRNONAA 81 05/08/2020   GFRAA 94 05/08/2020   CALCIUM 9.6 05/08/2020   PROT 7.0 05/08/2020   ALBUMIN 4.5 05/08/2020   LABGLOB 2.5 05/08/2020   AGRATIO 1.8 05/08/2020   BILITOT 0.3 05/08/2020   ALKPHOS 86 05/08/2020   AST 23 05/08/2020   ALT 16 05/08/2020   ANIONGAP 9 08/24/2015   Lab Results  Component Value Date   AMYLASE 91 12/01/2017   -----------------------------------------------------------------------------------------     Medications: Outpatient Medications Prior to Visit  Medication Sig  . amLODipine (NORVASC) 2.5 MG tablet TAKE 1 TABLET BY MOUTH EVERY DAY  . hydrochlorothiazide (MICROZIDE) 12.5 MG capsule Take 1 capsule (12.5 mg total) by mouth daily.  . MULTIPLE VITAMIN PO Take 1 capsule by mouth daily. Reported on 05/11/2016  . rosuvastatin (CRESTOR) 10 MG tablet TAKE 1 TABLET BY MOUTH EVERY DAY   No facility-administered medications prior to visit.        Objective    BP (!) 165/104 (BP Location: Right Arm, Patient Position: Sitting, Cuff Size: Normal)   Pulse 81   Temp 97.8 F (36.6  C) (Temporal)   Wt 161 lb (73 kg)   SpO2 97%   BMI 22.45 kg/m     Physical Exam   General Appearance:    Well developed, well nourished male, alert, cooperative, in no acute distress  Eyes:    PERRL, conjunctiva/corneas clear, EOM's intact       Lungs:     Clear to auscultation bilaterally, respirations unlabored  Heart:    Normal heart rate. Normal rhythm. No murmurs, rubs, or gallops.   Abdomen:   bowel sounds present and normal in all 4 quadrants, soft, round, nontender or nondistended. No CVA tenderness      Assessment & Plan     1. RUQ pain Has now completely resolved. He thinks it may have been due to extended periods without eating. He is going to  continue to be cognizant of eating schedule. If sx return he is to go to lab for following tests  - CBC - Comprehensive metabolic panel - Amylase - H. pylori breath test  2. Essential hypertension Off amlodipine due to possible GI side effects. Advised to resume but always take with food. Let me know if not tolerating, otherwise follow up for BP check in 6 weeks.   3. Hyperlipidemia, unspecified hyperlipidemia type Intolerant to pravastatin and rosuvastatin. Recheck at follow up in 6 weeks. Consider ezetimibe.     He declined flu vaccine due to being so late in season.      The entirety of the information documented in the History of Present Illness, Review of Systems and Physical Exam were personally obtained by me. Portions of this information were initially documented by the CMA and reviewed by me for thoroughness and accuracy.      Lelon Huh, MD  [facility] [phone] (phone) [phone] (fax)  [place]

## 2021-03-11 ENCOUNTER — Encounter: Payer: Self-pay | Admitting: Family Medicine

## 2021-03-11 ENCOUNTER — Ambulatory Visit (INDEPENDENT_AMBULATORY_CARE_PROVIDER_SITE_OTHER): Payer: Managed Care, Other (non HMO) | Admitting: Family Medicine

## 2021-03-11 ENCOUNTER — Other Ambulatory Visit: Payer: Self-pay

## 2021-03-11 VITALS — BP 145/93 | HR 71 | Resp 16 | Wt 162.0 lb

## 2021-03-11 DIAGNOSIS — I10 Essential (primary) hypertension: Secondary | ICD-10-CM

## 2021-03-11 DIAGNOSIS — E785 Hyperlipidemia, unspecified: Secondary | ICD-10-CM | POA: Diagnosis not present

## 2021-03-11 NOTE — Patient Instructions (Addendum)
.   Please review the attached list of medications and notify my office if there are any errors.   . I recommend Dr. Evorn Gong or Kellie Moor at [facility] at [phone]

## 2021-03-11 NOTE — Progress Notes (Signed)
Established patient visit   Patient: Martin Holland   DOB: [birth year]   55 y.o. Male  MRN: [id number] Visit Date: 03/11/2021  Today's healthcare provider: Lelon Huh, MD   Chief Complaint  Patient presents with  . Hypertension  . Hyperlipidemia   Subjective    HPI  Hypertension, follow-up  BP Readings from Last 3 Encounters:  03/11/21 (!) 145/93  01/27/21 (!) 165/104  08/08/20 (!) 144/93   Wt Readings from Last 3 Encounters:  03/11/21 162 lb (73.5 kg)  01/27/21 161 lb (73 kg)  05/08/20 167 lb (75.8 kg)     He was last seen for hypertension on 01/27/2021. BP at that visit was 165/104. At that time he was off amlodipine due to possible GI side effects. Advised to resume but always take with food. Call if not tolerating, otherwise follow up for BP check in 6 weeks  He reports good compliance with treatment. He is not having side effects.  He is following a Regular diet. He is exercising. He does smoke.  Use of agents associated with hypertension: none.   Outside blood pressures are 130-140/80-90. Symptoms: No chest pain No chest pressure  No palpitations No syncope  No dyspnea No orthopnea  No paroxysmal nocturnal dyspnea No lower extremity edema   Pertinent labs: Lab Results  Component Value Date   CHOL 270 (H) 05/08/2020   HDL 45 05/08/2020   LDLCALC 200 (H) 05/08/2020   TRIG 135 05/08/2020   CHOLHDL 6.0 (H) 05/08/2020   Lab Results  Component Value Date   NA 138 05/08/2020   K 4.3 05/08/2020   CREATININE 1.03 05/08/2020   GFRNONAA 81 05/08/2020   GFRAA 94 05/08/2020   GLUCOSE 91 05/08/2020     The 10-year ASCVD risk score Mikey Bussing DC Jr., et al., 2013) is: 23.1%   ---------------------------------------------------------------------------------------------------  Lipid/Cholesterol, Follow-up   Last lipid panel Other pertinent labs  Lab Results  Component Value Date   CHOL 270 (H) 05/08/2020   HDL 45 05/08/2020   LDLCALC 200 (H)  05/08/2020   TRIG 135 05/08/2020   CHOLHDL 6.0 (H) 05/08/2020   Lab Results  Component Value Date   ALT 16 05/08/2020   AST 23 05/08/2020   PLT 217 05/08/2020   TSH 1.970 12/01/2017     He was last seen for this on 01/27/2021. Management since that visit includes no changes. Will recheck levels in 6 week.Marland Kitchen  He reports good compliance with treatment. He is not having side effects.   Symptoms: No chest pain No chest pressure/discomfort  No dyspnea No lower extremity edema  No numbness or tingling of extremity No orthopnea  No palpitations No paroxysmal nocturnal dyspnea  No speech difficulty No syncope   Current diet: well balanced Current exercise: walking  The 10-year ASCVD risk score Mikey Bussing DC Jr., et al., 2013) is: 23.1%  ---------------------------------------------------------------------------------------------------  Skin check: Patient is requesting a referral to dermatology for skin assessment of multiple moles on his legs.       Medications: Outpatient Medications Prior to Visit  Medication Sig  . amLODipine (NORVASC) 2.5 MG tablet TAKE 1 TABLET BY MOUTH EVERY DAY  . hydrochlorothiazide (MICROZIDE) 12.5 MG capsule Take 1 capsule (12.5 mg total) by mouth daily.  . MULTIPLE VITAMIN PO Take 1 capsule by mouth daily. Reported on 05/11/2016   No facility-administered medications prior to visit.    Review of Systems  Constitutional: Negative for appetite change, chills and fever.  Respiratory: Negative for chest  tightness, shortness of breath and wheezing.   Cardiovascular: Negative for chest pain and palpitations.  Gastrointestinal: Negative for abdominal pain, nausea and vomiting.  Skin:       Moles on legs       Objective    BP (!) 145/93 (BP Location: Right Arm, Patient Position: Sitting, Cuff Size: Normal)   Pulse 71   Resp 16   Wt 162 lb (73.5 kg)   BMI 22.59 kg/m     Physical Exam   General appearance: Well developed, well nourished male,  cooperative and in no acute distress Head: Normocephalic, without obvious abnormality, atraumatic Respiratory: Respirations even and unlabored, normal respiratory rate    Assessment & Plan     1. Essential hypertension Not to goal but much better since starting hctz and low dose amlodipine. Continue current medications.   - CBC - TSH - T4, free  2. Hyperlipidemia, unspecified hyperlipidemia type  - Lipid panel - Comprehensive metabolic panel         The entirety of the information documented in the History of Present Illness, Review of Systems and Physical Exam were personally obtained by me. Portions of this information were initially documented by the CMA and reviewed by me for thoroughness and accuracy.      Lelon Huh, MD  [facility] [phone] (phone) [phone] (fax)  [place]

## 2021-03-12 ENCOUNTER — Other Ambulatory Visit: Payer: Self-pay | Admitting: Family Medicine

## 2021-03-12 DIAGNOSIS — E785 Hyperlipidemia, unspecified: Secondary | ICD-10-CM

## 2021-03-12 LAB — CBC
Hematocrit: 44.2 % (ref 37.5–51.0)
Hemoglobin: 15.2 g/dL (ref 13.0–17.7)
MCH: 30.4 pg (ref 26.6–33.0)
MCHC: 34.4 g/dL (ref 31.5–35.7)
MCV: 88 fL (ref 79–97)
Platelets: 215 10*3/uL (ref 150–450)
RBC: 5 x10E6/uL (ref 4.14–5.80)
RDW: 12.9 % (ref 11.6–15.4)
WBC: 8 10*3/uL (ref 3.4–10.8)

## 2021-03-12 LAB — LIPID PANEL
Chol/HDL Ratio: 5.8 ratio — ABNORMAL HIGH (ref 0.0–5.0)
Cholesterol, Total: 245 mg/dL — ABNORMAL HIGH (ref 100–199)
HDL: 42 mg/dL (ref >39)
LDL Chol Calc (NIH): 179 mg/dL — ABNORMAL HIGH (ref 0–99)
Triglycerides: 132 mg/dL (ref 0–149)
VLDL Cholesterol Cal: 24 mg/dL (ref 5–40)

## 2021-03-12 LAB — COMPREHENSIVE METABOLIC PANEL
ALT: 14 IU/L (ref 0–44)
AST: 16 IU/L (ref 0–40)
Albumin/Globulin Ratio: 1.8 (ref 1.2–2.2)
Albumin: 4.4 g/dL (ref 3.8–4.9)
Alkaline Phosphatase: 73 IU/L (ref 44–121)
BUN/Creatinine Ratio: 17 (ref 9–20)
BUN: 18 mg/dL (ref 6–24)
Bilirubin Total: 0.3 mg/dL (ref 0.0–1.2)
CO2: 21 mmol/L (ref 20–29)
Calcium: 9.3 mg/dL (ref 8.7–10.2)
Chloride: 102 mmol/L (ref 96–106)
Creatinine, Ser: 1.07 mg/dL (ref 0.76–1.27)
Globulin, Total: 2.5 g/dL (ref 1.5–4.5)
Glucose: 97 mg/dL (ref 65–99)
Potassium: 4.5 mmol/L (ref 3.5–5.2)
Sodium: 140 mmol/L (ref 134–144)
Total Protein: 6.9 g/dL (ref 6.0–8.5)
eGFR: 81 mL/min/{1.73_m2} (ref >59)

## 2021-03-12 LAB — T4, FREE: Free T4: 1.41 ng/dL (ref 0.82–1.77)

## 2021-03-12 LAB — TSH: TSH: 1.05 u[IU]/mL (ref 0.450–4.500)

## 2021-03-12 MED ORDER — COLESEVELAM HCL 625 MG PO TABS: 1875.0000 mg | ORAL_TABLET | Freq: Two times a day (BID) | ORAL | 4 refills | Status: DC

## 2021-05-08 ENCOUNTER — Other Ambulatory Visit: Payer: Self-pay | Admitting: Family Medicine

## 2021-05-08 DIAGNOSIS — I10 Essential (primary) hypertension: Secondary | ICD-10-CM

## 2021-05-08 MED ORDER — AMLODIPINE BESYLATE 2.5 MG PO TABS: 2.5000 mg | ORAL_TABLET | Freq: Every day | ORAL | 0 refills | Status: DC

## 2021-06-03 ENCOUNTER — Ambulatory Visit
Admission: EM | Admit: 2021-06-03 | Discharge: 2021-06-03 | Disposition: A | Discharge status: Home / Self Care | Payer: Managed Care, Other (non HMO) | Attending: Family Medicine | Admitting: Family Medicine

## 2021-06-03 ENCOUNTER — Other Ambulatory Visit: Payer: Self-pay

## 2021-06-03 ENCOUNTER — Telehealth: Payer: Self-pay

## 2021-06-03 ENCOUNTER — Ambulatory Visit (INDEPENDENT_AMBULATORY_CARE_PROVIDER_SITE_OTHER)
Admit: 2021-06-03 | Discharge: 2021-06-03 | Disposition: A | Discharge status: Home / Self Care | Payer: Managed Care, Other (non HMO) | Attending: Family Medicine | Admitting: Family Medicine

## 2021-06-03 ENCOUNTER — Ambulatory Visit (INDEPENDENT_AMBULATORY_CARE_PROVIDER_SITE_OTHER): Payer: Managed Care, Other (non HMO)

## 2021-06-03 DIAGNOSIS — R630 Anorexia: Secondary | ICD-10-CM | POA: Diagnosis not present

## 2021-06-03 DIAGNOSIS — I700 Atherosclerosis of aorta: Secondary | ICD-10-CM | POA: Insufficient documentation

## 2021-06-03 DIAGNOSIS — I70209 Unspecified atherosclerosis of native arteries of extremities, unspecified extremity: Secondary | ICD-10-CM

## 2021-06-03 DIAGNOSIS — R059 Cough, unspecified: Secondary | ICD-10-CM

## 2021-06-03 DIAGNOSIS — R634 Abnormal weight loss: Secondary | ICD-10-CM | POA: Diagnosis present

## 2021-06-03 HISTORY — DX: Pure hypercholesterolemia, unspecified: E78.00

## 2021-06-03 LAB — CBC WITH DIFFERENTIAL/PLATELET
Abs Immature Granulocytes: 0.02 10*3/uL (ref 0.00–0.07)
Basophils Absolute: 0.1 10*3/uL (ref 0.0–0.1)
Basophils Relative: 1 %
Eosinophils Absolute: 0.2 10*3/uL (ref 0.0–0.5)
Eosinophils Relative: 2 %
HCT: 43.5 % (ref 39.0–52.0)
Hemoglobin: 15.1 g/dL (ref 13.0–17.0)
Immature Granulocytes: 0 %
Lymphocytes Relative: 31 %
Lymphs Abs: 2.4 10*3/uL (ref 0.7–4.0)
MCH: 30.1 pg (ref 26.0–34.0)
MCHC: 34.7 g/dL (ref 30.0–36.0)
MCV: 86.8 fL (ref 80.0–100.0)
Monocytes Absolute: 0.5 10*3/uL (ref 0.1–1.0)
Monocytes Relative: 6 %
Neutro Abs: 4.7 10*3/uL (ref 1.7–7.7)
Neutrophils Relative %: 60 %
Platelets: 244 10*3/uL (ref 150–400)
RBC: 5.01 MIL/uL (ref 4.22–5.81)
RDW: 13 % (ref 11.5–15.5)
WBC: 7.8 10*3/uL (ref 4.0–10.5)
nRBC: 0 % (ref 0.0–0.2)

## 2021-06-03 LAB — COMPREHENSIVE METABOLIC PANEL
ALT: 20 U/L (ref 0–44)
AST: 18 U/L (ref 15–41)
Albumin: 4.4 g/dL (ref 3.5–5.0)
Alkaline Phosphatase: 79 U/L (ref 38–126)
Anion gap: 8 (ref 5–15)
BUN: 18 mg/dL (ref 6–20)
CO2: 27 mmol/L (ref 22–32)
Calcium: 9.3 mg/dL (ref 8.9–10.3)
Chloride: 103 mmol/L (ref 98–111)
Creatinine, Ser: 1.01 mg/dL (ref 0.61–1.24)
GFR, Estimated: >60 mL/min (ref >60)
Glucose, Bld: 101 mg/dL — ABNORMAL HIGH (ref 70–99)
Potassium: 3.9 mmol/L (ref 3.5–5.1)
Sodium: 138 mmol/L (ref 135–145)
Total Bilirubin: 0.4 mg/dL (ref 0.3–1.2)
Total Protein: 8.2 g/dL — ABNORMAL HIGH (ref 6.5–8.1)

## 2021-06-03 LAB — TSH: TSH: 1.73 u[IU]/mL (ref 0.350–4.500)

## 2021-06-03 LAB — PSA: Prostatic Specific Antigen: 0.31 ng/mL (ref 0.00–4.00)

## 2021-06-03 LAB — LIPASE, BLOOD: Lipase: 33 U/L (ref 11–51)

## 2021-06-03 MED ORDER — IOHEXOL 300 MG/ML  SOLN
100.0000 mL | Freq: Once | INTRAMUSCULAR | Status: AC | PRN
  Administered 2021-06-03: 18:00:00 100 mL via INTRAVENOUS

## 2021-06-03 MED ORDER — ASPIRIN EC 81 MG PO TBEC: 81.0000 mg | DELAYED_RELEASE_TABLET | Freq: Every day | ORAL | 11 refills | Status: AC

## 2021-06-03 MED ORDER — MIRTAZAPINE 15 MG PO TABS: 15.0000 mg | ORAL_TABLET | Freq: Every day | ORAL | 1 refills | Status: DC

## 2021-06-03 MED ORDER — EZETIMIBE 10 MG PO TABS: 10.0000 mg | ORAL_TABLET | Freq: Every day | ORAL | 3 refills | Status: DC

## 2021-06-03 NOTE — ED Triage Notes (Signed)
Pt c/o possible bug bite to his lower neck area, he noticed several days ago. Pt states the area does not itch or hurt now, but is red and irritated. Pt also reports he has decreased appetite and some weight loss. Pt also states his right lower arm has a numb/tingling feeling that is new, like his arm is going to sleep. Pt denies radiating sensation to his hand/fingers. Pt denies f/n/v/d, joint pain or other new symptoms. Pt is most concerned about his weight loss, from 170lb to 159lb, over the past 6 months; along with loss of appetite.

## 2021-06-03 NOTE — Telephone Encounter (Signed)
Copied from [place] [id number]. Topic: Appointment Scheduling - Scheduling Inquiry for Clinic >> Jun 03, 2021  2:47 PM Greggory Keen D wrote: Reason for CRM: Pt called saying he may have been bitten be a spider Friday.  He is swollen and rd about the size quarter.  It says it itches a little.  He wanted to be seen but there is nothing until Friday      He also mention a separate issue of have problems with eating.  He has seen Dr. Caryn Section  about this in March.  He feels fatigue almost everyday.   Please advise  CB#  [phone]

## 2021-06-03 NOTE — Telephone Encounter (Signed)
Pt is currently in the Urgent Care.   Thanks,   -Mickel Baas

## 2021-06-03 NOTE — Discharge Instructions (Addendum)
Medication as prescribed.  I have placed referral to vascular surgery.  They should be contacting you.  I also spoke with Dr. Allen Norris.  His office is going to see what they can do to get you in soon.  Take care  Dr. Lacinda Axon

## 2021-06-03 NOTE — ED Notes (Signed)
Patient has been approved for CPT [phone]. Obtained via [employer] . Valid for 06/03/21 through 11/30/21. Authorization # [id number].

## 2021-06-04 NOTE — ED Provider Notes (Signed)
[facility]    CSN: [id number] Arrival date & time: 06/03/21  1604      History   Chief Complaint Chief Complaint  Patient presents with   Insect Bite   Weight Loss    HPI  55 year old male presents for evaluation of the above.  Patient recently noted a suspected bug bite to the right clavicle.  The area is red and irritated.  Does not itch.  Is not painful.  He would like this examined.  Additionally, patient reports ongoing weight loss.  Patient has lost approximately 10 pounds in a year.  This is confirmed upon review of the weights in the electronic medical record.  He states that he feels like he is having difficulty eating.  He endorses early satiety.  He has been trying to drink protein shakes and increase his caloric intake to gain weight but has been unsuccessful.  Patient also reports recent pain in his right inner thigh/right groin which started last week.  He states that it is very tender.  No fall, trauma.  He believes that he did this while he was exercising.  Patient also notes some epigastric discomfort/pain.  He saw his primary care provider for this earlier in the year and work-up was unremarkable.  Patient denies depression or anxiety.  He does have a highly stressful job.  Family member concern that he is anxious.  Patient is a smoker.  He drinks occasionally.  Normal PSA last year.  Colonoscopy was done in 2017.  Most recent labs reviewed.   Past Medical History:  Diagnosis Date   Hypercholesteremia    Hypertension    Tobacco abuse     Patient Active Problem List   Diagnosis Date Noted   Hypertension    Lipoma 05/08/2020   Ocular migraine 12/27/2018   Benign neoplasm of sigmoid colon    Essential hypertension 05/11/2016   Chest pain 05/11/2016   Hyperlipidemia 08/23/2015   Palpitations 08/23/2015   Tobacco abuse 08/23/2015    Past Surgical History:  Procedure Laterality Date   COLONOSCOPY WITH PROPOFOL N/A 08/14/2016   Procedure:  COLONOSCOPY WITH PROPOFOL;  Surgeon: Lucilla Lame, MD;  Location: [facility];  Service: Endoscopy;  Laterality: N/A;   NO PAST SURGERIES         Home Medications    Prior to Admission medications   Medication Sig Start Date End Date Taking? Authorizing Provider  amLODipine (NORVASC) 2.5 MG tablet Take 1 tablet (2.5 mg total) by mouth daily. Please schedule an office visit before anymore refills. 05/08/21  Yes Birdie Sons, MD  aspirin EC 81 MG tablet Take 1 tablet (81 mg total) by mouth daily. Swallow whole. 06/03/21  Yes Lola Czerwonka G, DO  ezetimibe (ZETIA) 10 MG tablet Take 1 tablet (10 mg total) by mouth daily. 06/03/21  Yes Kista Robb G, DO  hydrochlorothiazide (MICROZIDE) 12.5 MG capsule Take 1 capsule (12.5 mg total) by mouth daily. 11/11/20  Yes Birdie Sons, MD  mirtazapine (REMERON) 15 MG tablet Take 1 tablet (15 mg total) by mouth at bedtime. 06/03/21  Yes Leyda Vanderwerf G, DO  MULTIPLE VITAMIN PO Take 1 capsule by mouth daily. Reported on 05/11/2016   Yes [provider]    Family History Family History  Problem Relation Age of Onset   Cancer Mother        small cell carcinoma   Heart disease Father     Social History Social History   Tobacco Use  Smoking status: Every Day    Packs/day: 0.50    Years: 20.00    Pack years: 10.00    Types: Cigarettes   Smokeless tobacco: Never   Tobacco comments:    previously smoked 1/2 ppd since age 15.   Substance Use Topics   Alcohol use: Yes    Alcohol/week: 4.0 standard drinks    Types: 4 Cans of beer per week    Comment: monthly   Drug use: Yes    Types: Marijuana     Allergies   Pravastatin sodium, Rosuvastatin, and Valsartan   Review of Systems Review of Systems Per HPI  Physical Exam Triage Vital Signs ED Triage Vitals  Enc Vitals Group     BP 06/03/21 1620 (!) 156/96     Pulse Rate 06/03/21 1620 77     Resp 06/03/21 1620 18     Temp 06/03/21 1620 98.5 F (36.9 C)     Temp Source  06/03/21 1620 Oral     SpO2 06/03/21 1620 100 %     Weight 06/03/21 1617 159 lb (72.1 kg)     Height 06/03/21 1617 5\' 9"  (1.753 m)     Head Circumference --      Peak Flow --      Pain Score 06/03/21 1617 0     Pain Loc --      Pain Edu? --      Excl. in [place]? --    No data found.  Updated Vital Signs BP (!) 156/96 (BP Location: Left Arm)   Pulse 77   Temp 98.5 F (36.9 C) (Oral)   Resp 18   Ht 5\' 9"  (1.753 m)   Wt 72.1 kg   SpO2 100%   BMI 23.48 kg/m   Visual Acuity Right Eye Distance:   Left Eye Distance:   Bilateral Distance:    Right Eye Near:   Left Eye Near:    Bilateral Near:     Physical Exam Vitals and nursing note reviewed.  Constitutional:      General: He is not in acute distress.    Appearance: Normal appearance. He is not ill-appearing.  HENT:     Head: Normocephalic and atraumatic.     Mouth/Throat:     Pharynx: Oropharynx is clear.  Eyes:     General:        Right eye: No discharge.        Left eye: No discharge.     Conjunctiva/sclera: Conjunctivae normal.  Cardiovascular:     Rate and Rhythm: Normal rate and regular rhythm.  Pulmonary:     Effort: Pulmonary effort is normal.     Breath sounds: Normal breath sounds. No wheezing, rhonchi or rales.  Abdominal:     General: There is no distension.     Palpations: Abdomen is soft.     Comments: Tender to palpation in the epigastric region.  Skin:    Comments: Area of erythema at the right medial clavicle.  No warmth.  Neurological:     Mental Status: He is alert.     UC Treatments / Results  Labs (all labs ordered are listed, but only abnormal results are displayed) Labs Reviewed  COMPREHENSIVE METABOLIC PANEL - Abnormal; Notable for the following components:      Result Value   Glucose, Bld 101 (*)    Total Protein 8.2 (*)    All other components within normal limits  CBC WITH DIFFERENTIAL/PLATELET  LIPASE, BLOOD  PSA  TSH  EKG   Radiology DG Chest 2 View  Result Date:  06/03/2021 CLINICAL DATA:  Cough cough. EXAM: CHEST - 2 VIEW COMPARISON:  08/24/2015 FINDINGS: Chronic hyperinflation.The cardiomediastinal contours are normal. Pulmonary vasculature is normal. No consolidation, pleural effusion, or pneumothorax. No acute osseous abnormalities are seen. IMPRESSION: Chronic hyperinflation without acute abnormality. Electronically Signed   By: Keith Rake M.D.   On: 06/03/2021 17:30   CT ABDOMEN PELVIS W CONTRAST  Result Date: 06/03/2021 CLINICAL DATA:  Decreased appetite and weight loss over the past 6 months. EXAM: CT ABDOMEN AND PELVIS WITH CONTRAST TECHNIQUE: Multidetector CT imaging of the abdomen and pelvis was performed using the standard protocol following bolus administration of intravenous contrast. CONTRAST:  152mL OMNIPAQUE IOHEXOL 300 MG/ML  SOLN COMPARISON:  None. FINDINGS: Lower chest: No acute abnormality. Hepatobiliary: Few subcentimeter low-density lesions in the liver are too small to characterize. The gallbladder is unremarkable. No biliary dilatation. Pancreas: Unremarkable. No pancreatic ductal dilatation or surrounding inflammatory changes. Spleen: Normal in size without focal abnormality. Adrenals/Urinary Tract: The adrenal glands and right kidney are unremarkable. 1.0 cm left renal cyst. No renal calculi or hydronephrosis. The bladder is unremarkable. Stomach/Bowel: Stomach is within normal limits. Appendix appears normal. No evidence of bowel wall thickening, distention, or inflammatory changes. Left-sided colonic diverticulosis. Vascular/Lymphatic: Aortic atherosclerosis. Occluded proximal right SFA with incompletely visualized distal reconstitution no enlarged abdominal or pelvic lymph nodes. Reproductive: Prostate is unremarkable. Other: No abdominal wall hernia or abnormality. No abdominopelvic ascites. No pneumoperitoneum. Musculoskeletal: No acute or significant osseous findings. IMPRESSION: 1. No acute intra-abdominal process. No explanation for  the patient's weight loss. 2. Occluded proximal right SFA with incompletely visualized distal reconstitution. Correlate with right lower leg symptoms to determine acuity. 3. Aortic Atherosclerosis (ICD10-I70.0). Electronically Signed   By: Titus Dubin M.D.   On: 06/03/2021 18:42    Procedures Procedures (including critical care time)  Medications Ordered in UC Medications - No data to display  Initial Impression / Assessment and Plan / UC Course  I have reviewed the triage vital signs and the nursing notes.  Pertinent labs & imaging results that were available during my care of the patient were reviewed by me and considered in my medical decision making (see chart for details).    55 year old male presents with several issues/concerns.  My most pressing concern is the unintentional weight loss.  Laboratory studies unremarkable.  Chest x-ray obtained given reports of weight loss and tobacco abuse.  Chest x-ray was independently reviewed by me.  Interpretation: Hyperinflation suggestive of COPD.  No acute abnormalities.  Given epigastric tenderness, early satiety, and weight loss CT abdomen and pelvis was obtained for further evaluation.  CT revealed no findings to account for patient's weight loss.  It did show an occluded SFA.  Case was discussed with vascular surgery Dr. Delana Meyer.  He recommended daily aspirin therapy, statin, and follow-up with vascular.  Patient was placed on Zetia as he cannot tolerate statin.  Daily aspirin 81 mg.  I have contacted patient's GI physician so that he can be seen for endoscopy.  He is also due for an upcoming colonoscopy.  Lastly, I am placing the patient on mirtazapine to try and stimulate appetite to prevent weight loss and promote weight gain.  Regarding the patient's area of concern (possible bug bite), there is no evidence of infection at this time.  Watchful waiting.  Final Clinical Impressions(s) / UC Diagnoses   Final diagnoses:  Unintentional  weight loss  Superficial femoral artery  occlusion [facility])     Discharge Instructions      Medication as prescribed.  I have placed referral to vascular surgery.  They should be contacting you.  I also spoke with Dr. Allen Norris.  His office is going to see what they can do to get you in soon.  Take care  Dr. Lacinda Axon    ED Prescriptions     Medication Sig Dispense Auth. Provider   aspirin EC 81 MG tablet Take 1 tablet (81 mg total) by mouth daily. Swallow whole. 30 tablet Yamil Oelke G, DO   ezetimibe (ZETIA) 10 MG tablet Take 1 tablet (10 mg total) by mouth daily. 90 tablet Anothony Bursch G, DO   mirtazapine (REMERON) 15 MG tablet Take 1 tablet (15 mg total) by mouth at bedtime. 90 tablet Thersa Salt G, DO      PDMP not reviewed this encounter.   Coral Spikes, DO 06/04/21 1600

## 2021-06-09 ENCOUNTER — Encounter: Payer: Self-pay | Admitting: Family Medicine

## 2021-06-14 ENCOUNTER — Other Ambulatory Visit: Payer: Self-pay | Admitting: Family Medicine

## 2021-06-14 DIAGNOSIS — E785 Hyperlipidemia, unspecified: Secondary | ICD-10-CM

## 2021-06-14 NOTE — Telephone Encounter (Signed)
dc'd 06/03/21 Thersa Salt DO

## 2021-06-19 ENCOUNTER — Other Ambulatory Visit: Payer: Self-pay | Admitting: Family Medicine

## 2021-06-19 ENCOUNTER — Encounter: Payer: Self-pay | Admitting: Family Medicine

## 2021-06-19 DIAGNOSIS — I70209 Unspecified atherosclerosis of native arteries of extremities, unspecified extremity: Secondary | ICD-10-CM

## 2021-06-23 ENCOUNTER — Other Ambulatory Visit (INDEPENDENT_AMBULATORY_CARE_PROVIDER_SITE_OTHER): Payer: Self-pay | Admitting: Family Medicine

## 2021-06-23 DIAGNOSIS — I70209 Unspecified atherosclerosis of native arteries of extremities, unspecified extremity: Secondary | ICD-10-CM

## 2021-06-24 ENCOUNTER — Ambulatory Visit (INDEPENDENT_AMBULATORY_CARE_PROVIDER_SITE_OTHER): Payer: Managed Care, Other (non HMO) | Admitting: Vascular Surgery

## 2021-06-24 ENCOUNTER — Other Ambulatory Visit: Payer: Self-pay

## 2021-06-24 ENCOUNTER — Ambulatory Visit (INDEPENDENT_AMBULATORY_CARE_PROVIDER_SITE_OTHER): Payer: Managed Care, Other (non HMO)

## 2021-06-24 VITALS — BP 165/102 | HR 71 | Ht 72.0 in | Wt 160.0 lb

## 2021-06-24 DIAGNOSIS — I70211 Atherosclerosis of native arteries of extremities with intermittent claudication, right leg: Secondary | ICD-10-CM | POA: Diagnosis not present

## 2021-06-24 DIAGNOSIS — Z720 Tobacco use: Secondary | ICD-10-CM | POA: Diagnosis not present

## 2021-06-24 DIAGNOSIS — I70219 Atherosclerosis of native arteries of extremities with intermittent claudication, unspecified extremity: Secondary | ICD-10-CM | POA: Insufficient documentation

## 2021-06-24 DIAGNOSIS — E785 Hyperlipidemia, unspecified: Secondary | ICD-10-CM | POA: Diagnosis not present

## 2021-06-24 DIAGNOSIS — I70209 Unspecified atherosclerosis of native arteries of extremities, unspecified extremity: Secondary | ICD-10-CM

## 2021-06-24 DIAGNOSIS — I10 Essential (primary) hypertension: Secondary | ICD-10-CM | POA: Diagnosis not present

## 2021-06-24 NOTE — Assessment & Plan Note (Signed)
Has not smoked a cigarette in about a month now.

## 2021-06-24 NOTE — Assessment & Plan Note (Signed)
blood pressure control important in reducing the progression of atherosclerotic disease. On appropriate oral medications.  

## 2021-06-24 NOTE — Progress Notes (Signed)
Patient ID: Martin Holland, male   DOB: 1966-03-05, 55 y.o.   MRN: [id number]  Chief Complaint  Patient presents with   New Patient (Initial Visit)    NP cook SFA occlusion. abi    HPI Martin Holland is a 55 y.o. male.  I am asked to see the patient by Dr. Lacinda Axon for evaluation of PAD.  The patient recently underwent a CT scan of the abdomen and pelvis which I have independently reviewed secondary to weight loss.  There was an incidental finding of a proximal right SFA occlusion with reconstitution of the mid right SFA.  Prior to this, the patient had no knowledge of PAD.  He had smoked less than 1/2 pack/day for many years but says he has not smoked since the CT scan which was the first of the month.  He actually says he feels much better after stopping smoking and has better energy and appetite.  The patient reports he can walk 20 minutes and he does so twice a day most days.  He says he has a slight tightness and fullness in his right calf with walking, but it is not overtly painful.  It is not currently limiting him from normal activities.  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  He does notice some fullness in his right leg with more strenuous activity.  ABIs were done today which showed biphasic waveforms and a right ABI of 0.93 with normal digital pressures.  On the left, his ABIs were 1.25 with triphasic waveforms.   Past Medical History:  Diagnosis Date   Hypercholesteremia    Hypertension    Tobacco abuse     Past Surgical History:  Procedure Laterality Date   COLONOSCOPY WITH PROPOFOL N/A 08/14/2016   Procedure: COLONOSCOPY WITH PROPOFOL;  Surgeon: Lucilla Lame, MD;  Location: [facility];  Service: Endoscopy;  Laterality: N/A;   NO PAST SURGERIES       Family History  Problem Relation Age of Onset   Cancer Mother        small cell carcinoma   Heart disease Father   No bleeding or clotting  disorders   Social History   Tobacco Use   Smoking status: Every Day    Packs/day: 0.50    Years: 30.00    Pack years: 15.00    Types: Cigarettes   Smokeless tobacco: Never   Tobacco comments:    previously smoked 1/2 ppd since age 31.   Substance Use Topics   Alcohol use: Yes    Alcohol/week: 4.0 standard drinks    Types: 4 Cans of beer per week    Comment: monthly   Drug use: Yes    Types: Marijuana     Allergies  Allergen Reactions   Pravastatin Sodium     Severe muscle cramps   Rosuvastatin     Ocular migraines   Valsartan     Vision disturbance    Current Outpatient Medications  Medication Sig Dispense Refill   amLODipine (NORVASC) 2.5 MG tablet Take 1 tablet (2.5 mg total) by mouth daily. Please schedule an office visit before anymore refills. 30 tablet 0   aspirin EC 81 MG tablet Take 1 tablet (81 mg total) by mouth daily. Swallow whole. 30 tablet 11   ezetimibe (ZETIA) 10 MG tablet Take 1 tablet (10 mg total) by mouth daily. 90 tablet 3   hydrochlorothiazide (MICROZIDE) 12.5 MG capsule Take  1 capsule (12.5 mg total) by mouth daily. 90 capsule 3   MULTIPLE VITAMIN PO Take 1 capsule by mouth daily. Reported on 05/11/2016     loteprednol (LOTEMAX) 0.5 % ophthalmic suspension Place 1 drop into the left eye 4 (four) times daily.     mirtazapine (REMERON) 15 MG tablet Take 1 tablet (15 mg total) by mouth at bedtime. (Patient not taking: Reported on 06/24/2021) 90 tablet 1   No current facility-administered medications for this visit.      REVIEW OF SYSTEMS (Negative unless checked)  Constitutional: '[]'$ Weight loss  '[]'$ Fever  '[]'$ Chills Cardiac: '[]'$ Chest pain   '[]'$ Chest pressure   '[]'$ Palpitations   '[]'$ Shortness of breath when laying flat   '[]'$ Shortness of breath at rest   '[]'$ Shortness of breath with exertion. Vascular:  '[x]'$ Pain in legs with walking   '[]'$ Pain in legs at rest   '[]'$ Pain in legs when laying flat   '[x]'$ Claudication   '[]'$ Pain in feet when walking  '[]'$ Pain in feet at  rest  '[]'$ Pain in feet when laying flat   '[]'$ History of DVT   '[]'$ Phlebitis   '[]'$ Swelling in legs   '[]'$ Varicose veins   '[]'$ Non-healing ulcers Pulmonary:   '[]'$ Uses home oxygen   '[]'$ Productive cough   '[]'$ Hemoptysis   '[]'$ Wheeze  '[]'$ COPD   '[]'$ Asthma Neurologic:  '[]'$ Dizziness  '[]'$ Blackouts   '[]'$ Seizures   '[]'$ History of stroke   '[]'$ History of TIA  '[]'$ Aphasia   '[]'$ Temporary blindness   '[]'$ Dysphagia   '[]'$ Weakness or numbness in arms   '[]'$ Weakness or numbness in legs Musculoskeletal:  '[]'$ Arthritis   '[]'$ Joint swelling   '[]'$ Joint pain   '[]'$ Low back pain Hematologic:  '[]'$ Easy bruising  '[]'$ Easy bleeding   '[]'$ Hypercoagulable state   '[]'$ Anemic  '[]'$ Hepatitis Gastrointestinal:  '[]'$ Blood in stool   '[]'$ Vomiting blood  '[]'$ Gastroesophageal reflux/heartburn   '[]'$ Abdominal pain Genitourinary:  '[]'$ Chronic kidney disease   '[]'$ Difficult urination  '[]'$ Frequent urination  '[]'$ Burning with urination   '[]'$ Hematuria Skin:  '[]'$ Rashes   '[]'$ Ulcers   '[]'$ Wounds Psychological:  '[]'$ History of anxiety   '[]'$  History of major depression.    Physical Exam BP (!) 165/102   Pulse 71   Ht 6' (1.829 m)   Wt 160 lb (72.6 kg)   BMI 21.70 kg/m  Gen:  WD/WN, NAD. Appears younger than stated age. Head: [place]/AT, No temporalis wasting.  Ear/Nose/Throat: Hearing grossly intact, nares w/o erythema or drainage, oropharynx w/o Erythema/Exudate Eyes: Conjunctiva clear, sclera non-icteric  Neck: trachea midline.  No JVD.  Pulmonary:  Good air movement, respirations not labored, no use of accessory muscles  Cardiac: RRR, no JVD Vascular:  Vessel Right Left  Radial Palpable Palpable                          PT 2+ 2+  DP 2+ 2+   Gastrointestinal:. No masses, surgical incisions, or scars. Musculoskeletal: M/S 5/5 throughout.  Extremities without ischemic changes.  No deformity or atrophy. No edema. Neurologic: Sensation grossly intact in extremities.  Symmetrical.  Speech is fluent. Motor exam as listed above. Psychiatric: Judgment intact, Mood & affect appropriate for pt's clinical  situation. Dermatologic: No rashes or ulcers noted.  No cellulitis or open wounds.    Radiology DG Chest 2 View  Result Date: 06/03/2021 CLINICAL DATA:  Cough cough. EXAM: CHEST - 2 VIEW COMPARISON:  08/24/2015 FINDINGS: Chronic hyperinflation.The cardiomediastinal contours are normal. Pulmonary vasculature is normal. No consolidation, pleural effusion, or pneumothorax. No acute osseous abnormalities are seen. IMPRESSION: Chronic hyperinflation without acute  abnormality. Electronically Signed   By: Keith Rake M.D.   On: 06/03/2021 17:30   CT ABDOMEN PELVIS W CONTRAST  Result Date: 06/03/2021 CLINICAL DATA:  Decreased appetite and weight loss over the past 6 months. EXAM: CT ABDOMEN AND PELVIS WITH CONTRAST TECHNIQUE: Multidetector CT imaging of the abdomen and pelvis was performed using the standard protocol following bolus administration of intravenous contrast. CONTRAST:  140m OMNIPAQUE IOHEXOL 300 MG/ML  SOLN COMPARISON:  None. FINDINGS: Lower chest: No acute abnormality. Hepatobiliary: Few subcentimeter low-density lesions in the liver are too small to characterize. The gallbladder is unremarkable. No biliary dilatation. Pancreas: Unremarkable. No pancreatic ductal dilatation or surrounding inflammatory changes. Spleen: Normal in size without focal abnormality. Adrenals/Urinary Tract: The adrenal glands and right kidney are unremarkable. 1.0 cm left renal cyst. No renal calculi or hydronephrosis. The bladder is unremarkable. Stomach/Bowel: Stomach is within normal limits. Appendix appears normal. No evidence of bowel wall thickening, distention, or inflammatory changes. Left-sided colonic diverticulosis. Vascular/Lymphatic: Aortic atherosclerosis. Occluded proximal right SFA with incompletely visualized distal reconstitution no enlarged abdominal or pelvic lymph nodes. Reproductive: Prostate is unremarkable. Other: No abdominal wall hernia or abnormality. No abdominopelvic ascites. No  pneumoperitoneum. Musculoskeletal: No acute or significant osseous findings. IMPRESSION: 1. No acute intra-abdominal process. No explanation for the patient's weight loss. 2. Occluded proximal right SFA with incompletely visualized distal reconstitution. Correlate with right lower leg symptoms to determine acuity. 3. Aortic Atherosclerosis (ICD10-I70.0). Electronically Signed   By: WTitus DubinM.D.   On: 06/03/2021 18:42    Labs Recent Results (from the past 2160 hour(s))  CBC with Differential     Status: None   Collection Time: 06/03/21  4:48 PM  Result Value Ref Range   WBC 7.8 4.0 - 10.5 K/uL   RBC 5.01 4.22 - 5.81 MIL/uL   Hemoglobin 15.1 13.0 - 17.0 g/dL   HCT 43.5 39.0 - 52.0 %   MCV 86.8 80.0 - 100.0 fL   MCH 30.1 26.0 - 34.0 pg   MCHC 34.7 30.0 - 36.0 g/dL   RDW 13.0 11.5 - 15.5 %   Platelets 244 150 - 400 K/uL   nRBC 0.0 0.0 - 0.2 %   Neutrophils Relative % 60 %   Neutro Abs 4.7 1.7 - 7.7 K/uL   Lymphocytes Relative 31 %   Lymphs Abs 2.4 0.7 - 4.0 K/uL   Monocytes Relative 6 %   Monocytes Absolute 0.5 0.1 - 1.0 K/uL   Eosinophils Relative 2 %   Eosinophils Absolute 0.2 0.0 - 0.5 K/uL   Basophils Relative 1 %   Basophils Absolute 0.1 0.0 - 0.1 K/uL   Immature Granulocytes 0 %   Abs Immature Granulocytes 0.02 0.00 - 0.07 K/uL    Comment: [facility], [address], [id number] Comprehensive metabolic panel     Status: Abnormal   Collection Time: 06/03/21  4:48 PM  Result Value Ref Range   Sodium 138 135 - 145 mmol/L   Potassium 3.9 3.5 - 5.1 mmol/L   Chloride 103 98 - 111 mmol/L   CO2 27 22 - 32 mmol/L   Glucose, Bld 101 (H) 70 - 99 mg/dL    Comment: Glucose reference range applies only to samples taken after fasting for at least 8 hours.   BUN 18 6 - 20 mg/dL   Creatinine, Ser 1.01 0.61 - 1.24 mg/dL   Calcium 9.3 8.9 - 10.3 mg/dL   Total Protein 8.2 (H) 6.5 -  8.1 g/dL   Albumin 4.4 3.5 - 5.0 g/dL   AST 18 15 - 41 U/L    ALT 20 0 - 44 U/L   Alkaline Phosphatase 79 38 - 126 U/L   Total Bilirubin 0.4 0.3 - 1.2 mg/dL   GFR, Estimated >60 >60 mL/min    Comment: (NOTE) Calculated using the CKD-EPI Creatinine Equation (2021)    Anion gap 8 5 - 15    Comment: Performed at [facility], [address] [place], [place] [address]  Lipase, blood     Status: None   Collection Time: 06/03/21  4:48 PM  Result Value Ref Range   Lipase 33 11 - 51 U/L    Comment: Performed at [facility], [address] [place], [place] [address]  PSA     Status: None   Collection Time: 06/03/21  4:48 PM  Result Value Ref Range   Prostatic Specific Antigen 0.31 0.00 - 4.00 ng/mL    Comment: (NOTE) While PSA levels of <=4.0 ng/ml are reported as reference range, some men with levels below 4.0 ng/ml can have prostate cancer and many men with PSA above 4.0 ng/ml do not have prostate cancer.  Other tests such as free PSA, age specific reference ranges, PSA velocity and PSA doubling time may be helpful especially in men less than 56 years old. Performed at [facility], [place] [address] [place], [place] [address]   TSH     Status: None   Collection Time: 06/03/21  4:48 PM  Result Value Ref Range   TSH 1.730 0.350 - 4.500 uIU/mL    Comment: Performed by a 3rd Generation assay with a functional sensitivity of <=0.01 uIU/mL. Performed at [facility], [place] [place],  [address]     Assessment/Plan:  Tobacco abuse Has not smoked a cigarette in about a month now.  Hyperlipidemia lipid control important in reducing the progression of atherosclerotic disease.    Essential hypertension blood pressure control important in reducing the progression of atherosclerotic disease. On appropriate oral medications.   Atherosclerosis of native arteries of extremity with intermittent claudication (HCC) ABIs were done today which showed biphasic waveforms and a right ABI of 0.93  with normal digital pressures.  On the left, his ABIs were 1.25 with triphasic waveforms.  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  The patient has exercise vigorously has compensated very well building up robust collaterals.  I did discuss that it is important for lifestyle modifications and risk factor modification to keep the disease from progressing over time.  His recent smoking cessation will be huge for maintaining his perfusion.  Controlling his blood pressure and cholesterol are also very important.  I have told him that if his symptoms are lifestyle limiting i.e. if he tries to run marathons again and cannot do it and this is bothersome to him, we can certainly consider intervention to that right SFA occlusion.  As long as his symptoms are not lifestyle limiting I would recommend continued aspirin therapy and exercise.  For now, he would prefer conservative measures which I would agree with.  I will plan to see him back in 6 months with noninvasive studies.      Leotis Pain 06/24/2021, 9:22 AM   This note was created with Dragon medical transcription system.  Any errors from dictation are unintentional.

## 2021-06-24 NOTE — Assessment & Plan Note (Signed)
lipid control important in reducing the progression of atherosclerotic disease.   

## 2021-06-24 NOTE — Assessment & Plan Note (Signed)
ABIs were done today which showed biphasic waveforms and a right ABI of 0.93 with normal digital pressures.  On the left, his ABIs were 1.25 with triphasic waveforms.  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  The patient has exercise vigorously has compensated very well building up robust collaterals.  I did discuss that it is important for lifestyle modifications and risk factor modification to keep the disease from progressing over time.  His recent smoking cessation will be huge for maintaining his perfusion.  Controlling his blood pressure and cholesterol are also very important.  I have told him that if his symptoms are lifestyle limiting i.e. if he tries to run marathons again and cannot do it and this is bothersome to him, we can certainly consider intervention to that right SFA occlusion.  As long as his symptoms are not lifestyle limiting I would recommend continued aspirin therapy and exercise.  For now, he would prefer conservative measures which I would agree with.  I will plan to see him back in 6 months with noninvasive studies.

## 2021-08-04 ENCOUNTER — Other Ambulatory Visit: Payer: Self-pay | Admitting: Family Medicine

## 2021-08-04 DIAGNOSIS — I10 Essential (primary) hypertension: Secondary | ICD-10-CM

## 2021-08-15 ENCOUNTER — Encounter: Payer: Self-pay | Admitting: Family Medicine

## 2021-08-15 ENCOUNTER — Ambulatory Visit (INDEPENDENT_AMBULATORY_CARE_PROVIDER_SITE_OTHER): Payer: Managed Care, Other (non HMO) | Admitting: Family Medicine

## 2021-08-15 ENCOUNTER — Other Ambulatory Visit: Payer: Self-pay

## 2021-08-15 VITALS — BP 157/98 | HR 71 | Temp 98.1°F | Resp 18 | Ht 72.0 in | Wt 170.0 lb

## 2021-08-15 DIAGNOSIS — Z86010 Personal history of colonic polyps: Secondary | ICD-10-CM | POA: Diagnosis not present

## 2021-08-15 DIAGNOSIS — E785 Hyperlipidemia, unspecified: Secondary | ICD-10-CM | POA: Diagnosis not present

## 2021-08-15 DIAGNOSIS — Z7189 Other specified counseling: Secondary | ICD-10-CM

## 2021-08-15 DIAGNOSIS — I10 Essential (primary) hypertension: Secondary | ICD-10-CM | POA: Diagnosis not present

## 2021-08-15 DIAGNOSIS — Z0000 Encounter for general adult medical examination without abnormal findings: Secondary | ICD-10-CM

## 2021-08-15 DIAGNOSIS — Z136 Encounter for screening for cardiovascular disorders: Secondary | ICD-10-CM

## 2021-08-15 NOTE — Progress Notes (Signed)
Complete physical exam   Patient: Martin Holland   DOB: [birth year]   55 y.o. Male  MRN: [id number] Visit Date: 08/15/2021  Today's healthcare provider: Lelon Huh, MD   Chief Complaint  Patient presents with   Annual Exam   Hypertension   Hyperlipidemia   Subjective    Martin Holland is a 55 y.o. male who presents today for a complete physical exam.  He reports consuming a general diet. Home exercise routine includes walking 2 miles daily. He generally feels fairly well. He reports sleeping fairly well. He does not have additional problems to discuss today.  HPI  Hypertension, follow-up  BP Readings from Last 3 Encounters:  08/15/21 (!) 157/98  06/24/21 (!) 165/102  06/03/21 (!) 156/96   Wt Readings from Last 3 Encounters:  08/15/21 170 lb (77.1 kg)  06/24/21 160 lb (72.6 kg)  06/03/21 159 lb (72.1 kg)     He was last seen for hypertension 5 months ago.  BP at that visit was 145/93. Management since that visit includes continuing same medication.  He reports good compliance with treatment. He is not having side effects.  He is following a Regular diet. He is exercising. He does not smoke.  Use of agents associated with hypertension: NSAIDS.   Outside blood pressures are not checked. Symptoms: No chest pain No chest pressure  No palpitations No syncope  No dyspnea No orthopnea  No paroxysmal nocturnal dyspnea No lower extremity edema   Pertinent labs: Lab Results  Component Value Date   NA 138 06/03/2021   K 3.9 06/03/2021   CREATININE 1.01 06/03/2021   GFRNONAA >60 06/03/2021   GFRAA 94 05/08/2020   GLUCOSE 101 (H) 06/03/2021     The 10-year ASCVD risk score (Arnett DK, et al., 2019) is: 14.5%   ---------------------------------------------------------------------------------------------------   Lipid/Cholesterol, Follow-up  Last lipid panel Other pertinent labs  Lab Results  Component Value Date   CHOL 245 (H) 03/11/2021   HDL 42  03/11/2021   LDLCALC 179 (H) 03/11/2021   TRIG 132 03/11/2021   CHOLHDL 5.8 (H) 03/11/2021   Lab Results  Component Value Date   ALT 20 06/03/2021   AST 18 06/03/2021   PLT 244 06/03/2021   TSH 1.730 06/03/2021     He was last seen for this 5 months ago.  Management since that visit includes starting [facility]. Patient was later changed to Zetia.  He reports good compliance with treatment. He is not having side effects.   Symptoms: No chest pain No chest pressure/discomfort  No dyspnea No lower extremity edema  No numbness or tingling of extremity No orthopnea  No palpitations No paroxysmal nocturnal dyspnea  No speech difficulty No syncope   Current diet: well balanced Current exercise: walking  The 10-year ASCVD risk score (Arnett DK, et al., 2019) is: 14.5%  ---------------------------------------------------------------------------------------------------   Past Medical History:  Diagnosis Date   Hypercholesteremia    Hypertension    Tobacco abuse    Past Surgical History:  Procedure Laterality Date   COLONOSCOPY WITH PROPOFOL N/A 08/14/2016   Procedure: COLONOSCOPY WITH PROPOFOL;  Surgeon: Lucilla Lame, MD;  Location: [place] [facility];  Service: Endoscopy;  Laterality: N/A;   NO PAST SURGERIES     Social History   Socioeconomic History   Marital status: Single    Spouse name: Not on file   Number of children: 0   Years of education: Not on file   Highest education level: Not on file  Occupational History   Occupation: Employeed    Comment: self employed  Tobacco Use   Smoking status: Former    Packs/day: 0.50    Years: 30.00    Pack years: 15.00    Types: Cigarettes    Quit date: 06/04/2021    Years since quitting: 0.1   Smokeless tobacco: Never   Tobacco comments:    previously smoked 1/2 ppd since age 3.   Substance and Sexual Activity   Alcohol use: Yes    Alcohol/week: 4.0 standard drinks    Types: 4 Cans of beer per week    Comment: monthly    Drug use: Yes    Types: Marijuana   Sexual activity: Not on file  Other Topics Concern   Not on file  Social History Narrative   Not on file   Social Determinants of Health   Financial Resource Strain: Not on file  Food Insecurity: Not on file  Transportation Needs: Not on file  Physical Activity: Not on file  Stress: Not on file  Social Connections: Not on file  Intimate Partner Violence: Not on file   Family Status  Relation Name Status   Mother  Deceased       small cell carcinoma   Father  Deceased   Sister  Alive   Family History  Problem Relation Age of Onset   Cancer Mother        small cell carcinoma   Heart disease Father    Allergies  Allergen Reactions   Pravastatin Sodium     Severe muscle cramps   Rosuvastatin     Ocular migraines   Valsartan     Vision disturbance    Patient Care Team: Birdie Sons, MD as PCP - General (Family Medicine) Margaretha Sheffield, MD (Otolaryngology)   Medications: Outpatient Medications Prior to Visit  Medication Sig   amLODipine (NORVASC) 2.5 MG tablet TAKE 1 TABLET BY MOUTH EVERY DAY   aspirin EC 81 MG tablet Take 1 tablet (81 mg total) by mouth daily. Swallow whole. (Patient taking differently: Take 162 mg by mouth daily. Swallow whole.)   ezetimibe (ZETIA) 10 MG tablet Take 1 tablet (10 mg total) by mouth daily.   hydrochlorothiazide (MICROZIDE) 12.5 MG capsule Take 1 capsule (12.5 mg total) by mouth daily.   loteprednol (LOTEMAX) 0.5 % ophthalmic suspension Place 1 drop into the left eye 4 (four) times daily.   MULTIPLE VITAMIN PO Take 1 capsule by mouth daily. Reported on 05/11/2016   [DISCONTINUED] mirtazapine (REMERON) 15 MG tablet Take 1 tablet (15 mg total) by mouth at bedtime. (Patient not taking: Reported on 06/24/2021)   No facility-administered medications prior to visit.    Review of Systems  Constitutional:  Negative for appetite change, chills, fatigue and fever.  HENT:  Positive for tinnitus.  Negative for congestion, ear pain, hearing loss, nosebleeds and trouble swallowing.   Eyes:  Positive for photophobia. Negative for pain and visual disturbance.  Respiratory:  Negative for cough, chest tightness and shortness of breath.   Cardiovascular:  Negative for chest pain, palpitations and leg swelling.  Gastrointestinal:  Negative for abdominal pain, blood in stool, constipation, diarrhea, nausea and vomiting.  Endocrine: Negative for polydipsia, polyphagia and polyuria.  Genitourinary:  Negative for dysuria and flank pain.  Musculoskeletal:  Negative for arthralgias, back pain, joint swelling, myalgias and neck stiffness.  Skin:  Negative for color change, rash and wound.  Neurological:  Negative for dizziness, tremors, seizures, speech difficulty, weakness, light-headedness  and headaches.  Psychiatric/Behavioral:  Negative for behavioral problems, confusion, decreased concentration, dysphoric mood and sleep disturbance. The patient is not nervous/anxious.   All other systems reviewed and are negative.     Objective    BP (!) 157/98 (BP Location: Right Arm, Patient Position: Sitting, Cuff Size: Normal)   Pulse 71   Temp 98.1 F (36.7 C) (Temporal)   Resp 18   Ht 6' (1.829 m)   Wt 170 lb (77.1 kg)   BMI 23.06 kg/m     Physical Exam   General Appearance:    Well developed, well nourished male. Alert, cooperative, in no acute distress, appears stated age  Head:    Normocephalic, without obvious abnormality, atraumatic  Eyes:    PERRL, conjunctiva/corneas clear, EOM's intact, fundi    benign, both eyes       Ears:    Normal TM's and external ear canals, both ears  Neck:   Supple, symmetrical, trachea midline, no adenopathy;       thyroid:  No enlargement/tenderness/nodules; no carotid   bruit or JVD  Back:     Symmetric, no curvature, ROM normal, no CVA tenderness  Lungs:     Clear to auscultation bilaterally, respirations unlabored  Chest wall:    No tenderness or  deformity  Heart:    Normal heart rate. Normal rhythm. No murmurs, rubs, or gallops.  S1 and S2 normal  Abdomen:     Soft, non-tender, bowel sounds active all four quadrants,    no masses, no organomegaly  Genitalia:    deferred  Rectal:    deferred  Extremities:   All extremities are intact. No cyanosis or edema  Pulses:   2+ and symmetric all extremities  Skin:   Skin color, texture, turgor normal, no rashes or lesions  Lymph nodes:   Cervical, supraclavicular, and axillary nodes normal  Neurologic:   CNII-XII intact. Normal strength, sensation and reflexes      throughout     Last depression screening scores PHQ 2/9 Scores 08/15/2021 01/27/2021 05/08/2020  PHQ - 2 Score - 0 0  PHQ- 9 Score - 5 -  Exception Documentation Patient refusal - -   Last fall risk screening Fall Risk  01/27/2021  Falls in the past year? 0  Number falls in past yr: 0  Injury with Fall? 0  Follow up -   Last Audit-C alcohol use screening No flowsheet data found. A score of 3 or more in women, and 4 or more in men indicates increased risk for alcohol abuse, EXCEPT if all of the points are from question 1   No results found for any visits on 08/15/21.  Assessment & Plan    Routine Health Maintenance and Physical Exam  Exercise Activities and Dietary recommendations  Goals   None     Immunization History  Administered Date(s) Administered   Influenza,inj,Quad PF,6+ Mos 11/26/2017, 11/25/2018   Td 11/30/2004   Tdap 08/12/2011    Health Maintenance  Topic Date Due   COVID-19 Vaccine (1) Never done   Zoster Vaccines- Shingrix (1 of 2) Never done   TETANUS/TDAP  08/11/2021   COLONOSCOPY (Pts 45-74yr Insurance coverage will need to be confirmed)  08/14/2021   INFLUENZA VACCINE  02/27/2022 (Originally 06/30/2021)   Hepatitis C Screening  Completed   HIV Screening  Completed   HPV VACCINES  Aged Out    Discussed health benefits of physical activity, and encouraged him to engage in regular  exercise appropriate for his  age and condition.  1. Annual physical exam Normal exam. Flu vaccine was recommended but refused by patient   2. Hyperlipidemia, unspecified hyperlipidemia type He stopped statin due to side effects and is tolerating Ezetimibe with no adverse effects.  - CBC - Lipid panel  He does have several risk factors for CAD. Would benefit from Coronary screening to better determine whether more aggressive risk factor modfication is requires.  - CT CARDIAC SCORING (SELF PAY ONLY); Future  3. History of adenomatous polyp of colon He had two very small tubular adenomas excised in 2017. Reviewed updated guidelines recommend repeat colonoscopy after 7-10 years. Anticipated referral for colonoscopy in 2024  4. Primary hypertension Tolerating current medications. Consider increasing after reviewing labs.  - Comprehensive metabolic panel  5. Encounter for special screening examination for cardiovascular disorder  - CT CARDIAC SCORING (SELF PAY ONLY); Future  6. Encounter for cardiac risk counseling  - CT CARDIAC SCORING (SELF PAY ONLY); Future        The entirety of the information documented in the History of Present Illness, Review of Systems and Physical Exam were personally obtained by me. Portions of this information were initially documented by the CMA and reviewed by me for thoroughness and accuracy.     Lelon Huh, MD  [facility] [phone] (phone) [phone] (fax)  [place]

## 2021-08-15 NOTE — Patient Instructions (Signed)
.   Please review the attached list of medications and notify my office if there are any errors.   . Please bring all of your medications to every appointment so we can make sure that our medication list is the same as yours.    The CDC recommends two doses of Shingrix (the shingles vaccine) separated by 2 to 6 months for adults age 56 years and older. I recommend checking with your pharmacy plan regarding coverage for this vaccine.     

## 2021-08-16 LAB — LIPID PANEL
Chol/HDL Ratio: 4.1 ratio (ref 0.0–5.0)
Cholesterol, Total: 218 mg/dL — ABNORMAL HIGH (ref 100–199)
HDL: 53 mg/dL (ref >39)
LDL Chol Calc (NIH): 137 mg/dL — ABNORMAL HIGH (ref 0–99)
Triglycerides: 158 mg/dL — ABNORMAL HIGH (ref 0–149)
VLDL Cholesterol Cal: 28 mg/dL (ref 5–40)

## 2021-08-16 LAB — COMPREHENSIVE METABOLIC PANEL
ALT: 26 IU/L (ref 0–44)
AST: 22 IU/L (ref 0–40)
Albumin/Globulin Ratio: 1.6 (ref 1.2–2.2)
Albumin: 4.6 g/dL (ref 3.8–4.9)
Alkaline Phosphatase: 85 IU/L (ref 44–121)
BUN/Creatinine Ratio: 16 (ref 9–20)
BUN: 16 mg/dL (ref 6–24)
Bilirubin Total: 0.4 mg/dL (ref 0.0–1.2)
CO2: 26 mmol/L (ref 20–29)
Calcium: 9.9 mg/dL (ref 8.7–10.2)
Chloride: 102 mmol/L (ref 96–106)
Creatinine, Ser: 1.01 mg/dL (ref 0.76–1.27)
Globulin, Total: 2.8 g/dL (ref 1.5–4.5)
Glucose: 101 mg/dL — ABNORMAL HIGH (ref 65–99)
Potassium: 4.5 mmol/L (ref 3.5–5.2)
Sodium: 141 mmol/L (ref 134–144)
Total Protein: 7.4 g/dL (ref 6.0–8.5)
eGFR: 87 mL/min/{1.73_m2} (ref >59)

## 2021-08-16 LAB — CBC
Hematocrit: 42.1 % (ref 37.5–51.0)
Hemoglobin: 15 g/dL (ref 13.0–17.7)
MCH: 30.9 pg (ref 26.6–33.0)
MCHC: 35.6 g/dL (ref 31.5–35.7)
MCV: 87 fL (ref 79–97)
Platelets: 214 10*3/uL (ref 150–450)
RBC: 4.85 x10E6/uL (ref 4.14–5.80)
RDW: 13 % (ref 11.6–15.4)
WBC: 6.9 10*3/uL (ref 3.4–10.8)

## 2021-08-22 ENCOUNTER — Ambulatory Visit
Admission: RE | Admit: 2021-08-22 | Discharge: 2021-08-22 | Disposition: A | Discharge status: Home / Self Care | Payer: BLUE CROSS/BLUE SHIELD | Source: Ambulatory Visit | Attending: Family Medicine | Admitting: Family Medicine

## 2021-08-22 ENCOUNTER — Other Ambulatory Visit: Payer: Self-pay

## 2021-08-22 DIAGNOSIS — E785 Hyperlipidemia, unspecified: Secondary | ICD-10-CM | POA: Insufficient documentation

## 2021-08-22 DIAGNOSIS — Z7189 Other specified counseling: Secondary | ICD-10-CM | POA: Insufficient documentation

## 2021-08-22 DIAGNOSIS — Z136 Encounter for screening for cardiovascular disorders: Secondary | ICD-10-CM | POA: Insufficient documentation

## 2021-08-31 ENCOUNTER — Other Ambulatory Visit: Payer: Self-pay | Admitting: Family Medicine

## 2021-08-31 DIAGNOSIS — I10 Essential (primary) hypertension: Secondary | ICD-10-CM

## 2021-10-16 ENCOUNTER — Telehealth (INDEPENDENT_AMBULATORY_CARE_PROVIDER_SITE_OTHER): Payer: Self-pay

## 2021-10-16 NOTE — Telephone Encounter (Signed)
Pt called and left a VM on the nurses line wanting to make our office aware he is having Rt LE issues I called the pt and left a VM for him to call back and give more detail about his situation.

## 2021-12-30 ENCOUNTER — Encounter (INDEPENDENT_AMBULATORY_CARE_PROVIDER_SITE_OTHER): Payer: Self-pay

## 2021-12-30 ENCOUNTER — Other Ambulatory Visit: Payer: Self-pay

## 2021-12-30 ENCOUNTER — Ambulatory Visit (INDEPENDENT_AMBULATORY_CARE_PROVIDER_SITE_OTHER): Payer: Managed Care, Other (non HMO)

## 2021-12-30 ENCOUNTER — Ambulatory Visit (INDEPENDENT_AMBULATORY_CARE_PROVIDER_SITE_OTHER): Payer: Managed Care, Other (non HMO) | Admitting: Vascular Surgery

## 2021-12-30 VITALS — BP 157/98 | HR 68 | Ht 71.0 in | Wt 171.0 lb

## 2021-12-30 DIAGNOSIS — I70211 Atherosclerosis of native arteries of extremities with intermittent claudication, right leg: Secondary | ICD-10-CM | POA: Diagnosis not present

## 2021-12-30 DIAGNOSIS — E785 Hyperlipidemia, unspecified: Secondary | ICD-10-CM

## 2021-12-30 DIAGNOSIS — I10 Essential (primary) hypertension: Secondary | ICD-10-CM

## 2021-12-30 NOTE — Progress Notes (Signed)
Patient left before being seen.

## 2022-01-20 IMAGING — CT CT ABD-PELV W/ CM
1 of 3 series · 14 of 32 positions shown, 19 images · IV contrast (APPLIED)
Comparison: None.

CLINICAL DATA: Decreased appetite and weight loss over the past 6
months.

EXAM:
CT ABDOMEN AND PELVIS WITH CONTRAST
TECHNIQUE: Multidetector CT imaging of the abdomen and pelvis was performed
using the standard protocol following bolus administration of
intravenous contrast.
CONTRAST:  100mL OMNIPAQUE IOHEXOL 300 MG/ML  SOLN

[Series 2: axial st · axial · 0.87mm/px · z∈[-1248,-828]mm · 14 of 96 slices shown, 19 images]
[im 6/96  soft-tissue]
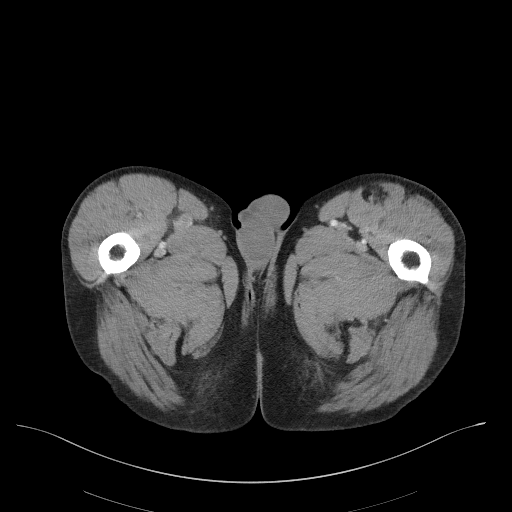
[im 6/96  bone]
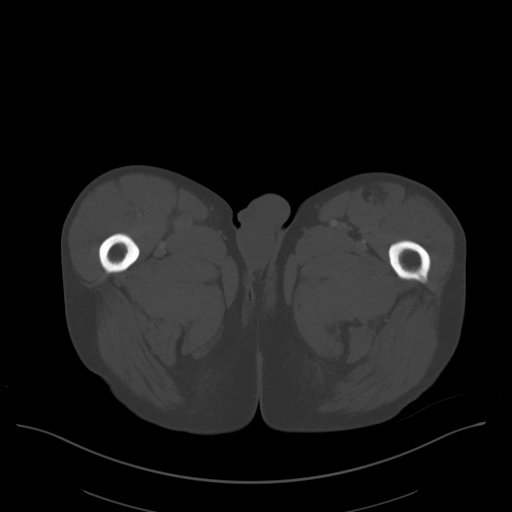
[im 11/96  soft-tissue]
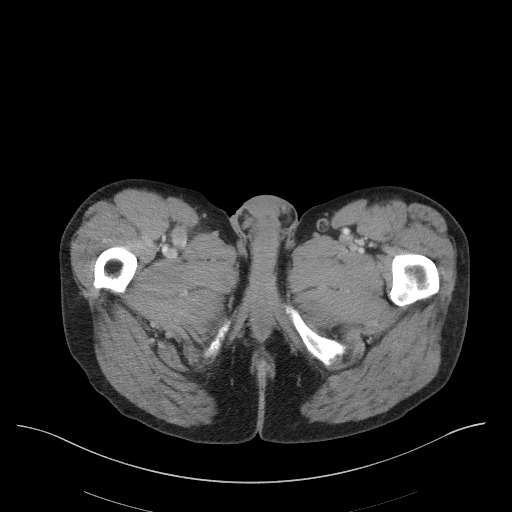
[im 22/96  soft-tissue]
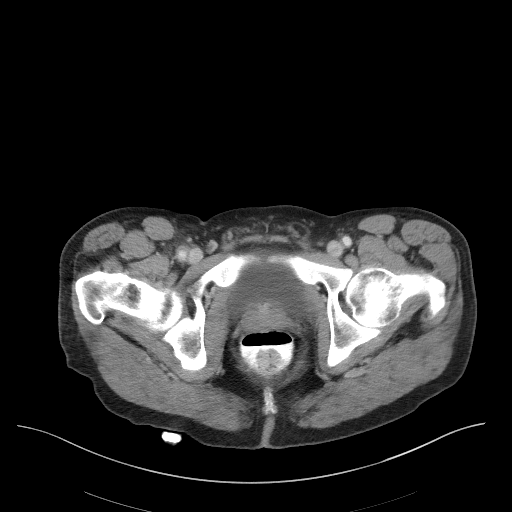
[im 27/96  soft-tissue]
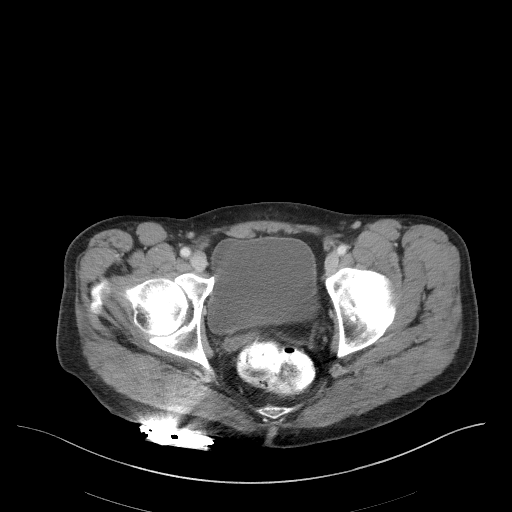
[im 32/96  soft-tissue]
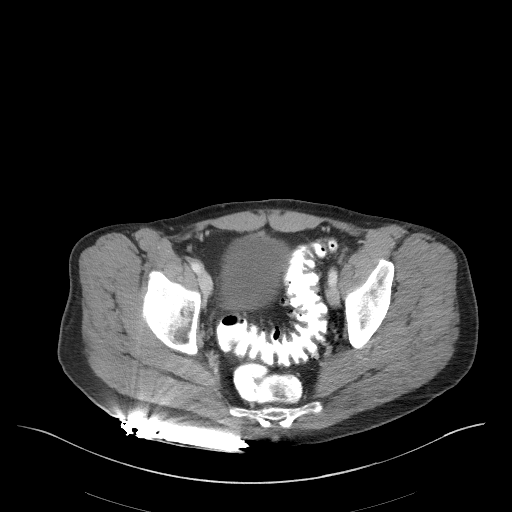
[im 43/96  soft-tissue]
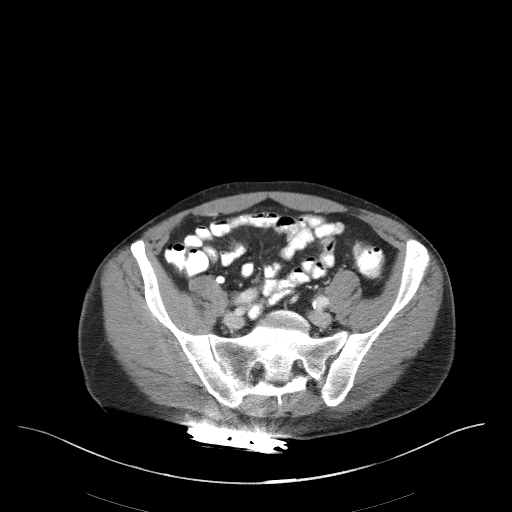
[im 48/96  soft-tissue]
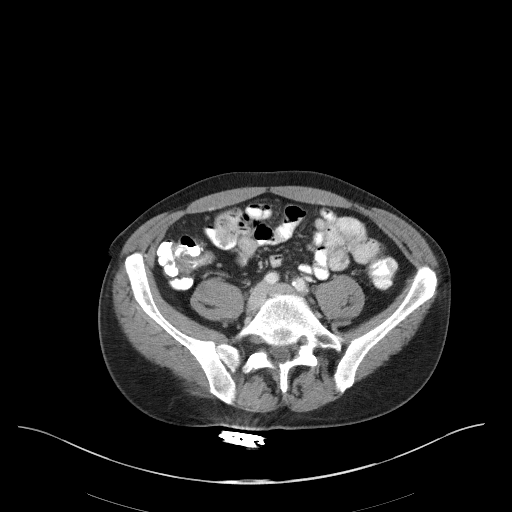
[im 53/96  soft-tissue]
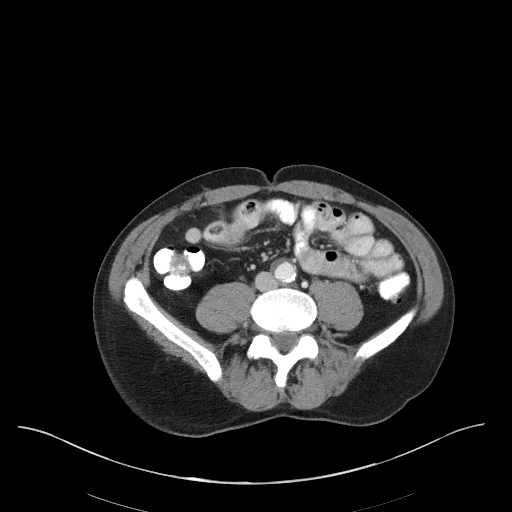
[im 64/96  soft-tissue]
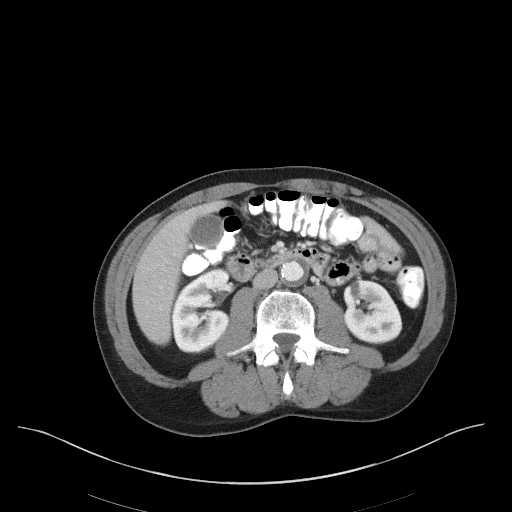
[im 64/96  bone]
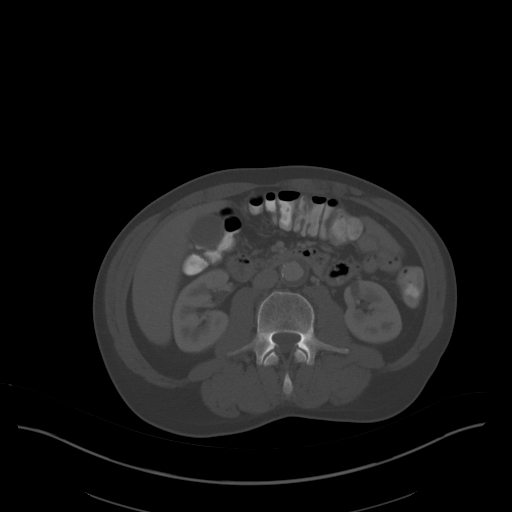
[im 69/96  soft-tissue]
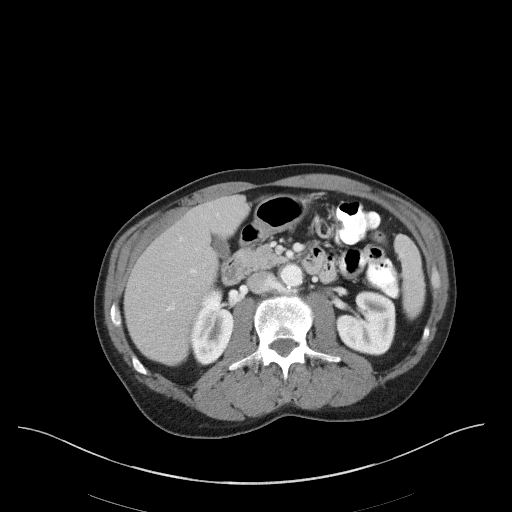
[im 74/96  soft-tissue]
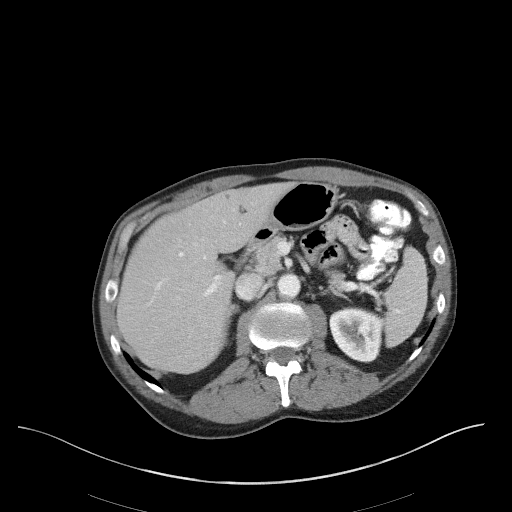
[im 74/96  lung]
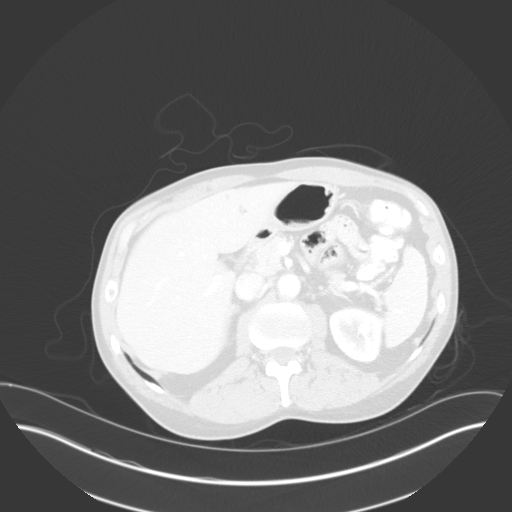
[im 80/96  lung]
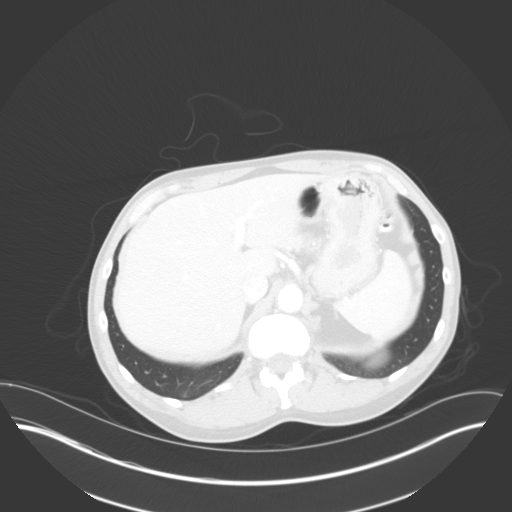
[im 85/96  soft-tissue]
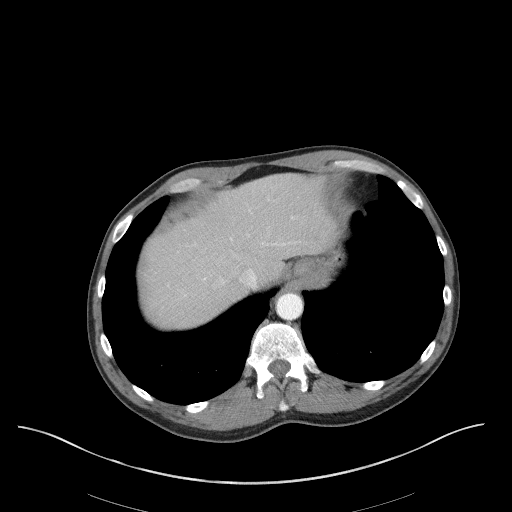
[im 85/96  lung]
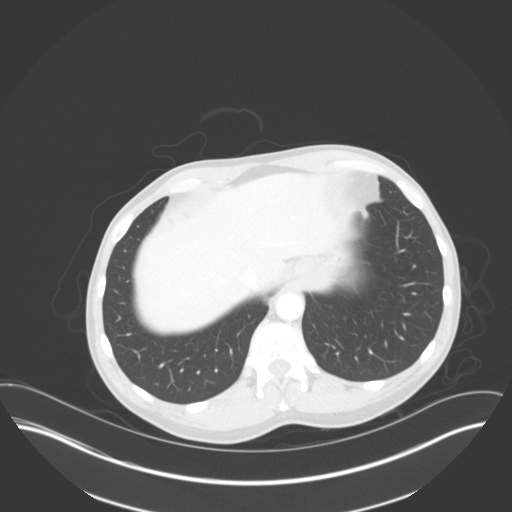
[im 90/96  soft-tissue]
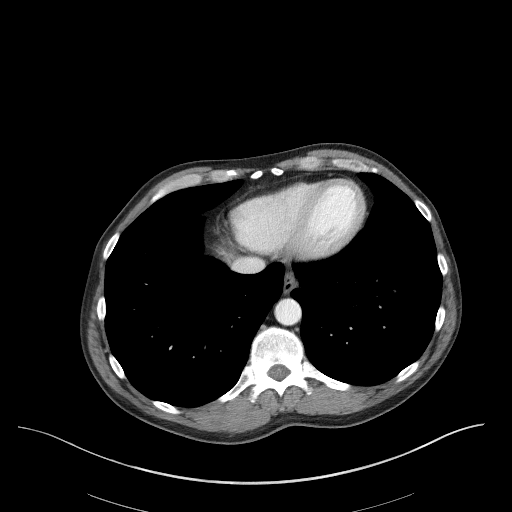
[im 90/96  lung]
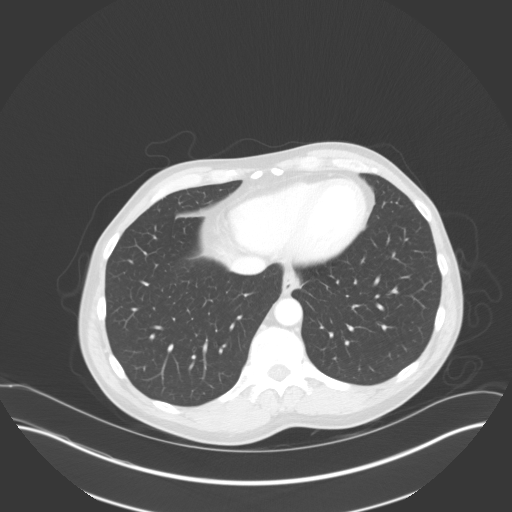

[14 of 32 positions shown; findings below may reference images not displayed]

FINDINGS: Lower chest: No acute abnormality.

Hepatobiliary: Few subcentimeter low-density lesions in the liver
are too small to characterize. The gallbladder is unremarkable. No
biliary dilatation.

Pancreas: Unremarkable. No pancreatic ductal dilatation or
surrounding inflammatory changes.

Spleen: Normal in size without focal abnormality.

Adrenals/Urinary Tract: The adrenal glands and right kidney are
unremarkable. 1.0 cm left renal cyst. No renal calculi or
hydronephrosis. The bladder is unremarkable.

Stomach/Bowel: Stomach is within normal limits. Appendix appears
normal. No evidence of bowel wall thickening, distention, or
inflammatory changes. Left-sided colonic diverticulosis.

Vascular/Lymphatic: Aortic atherosclerosis. Occluded proximal right
SFA with incompletely visualized distal reconstitution no enlarged
abdominal or pelvic lymph nodes.

Reproductive: Prostate is unremarkable.

Other: No abdominal wall hernia or abnormality. No abdominopelvic
ascites. No pneumoperitoneum.

Musculoskeletal: No acute or significant osseous findings.
IMPRESSION: 1. No acute intra-abdominal process. No explanation for the
patient's weight loss.
2. Occluded proximal right SFA with incompletely visualized distal
reconstitution. Correlate with right lower leg symptoms to determine
acuity.
3. Aortic Atherosclerosis ([id number]-SP1.1).

## 2022-02-13 ENCOUNTER — Other Ambulatory Visit: Payer: Self-pay | Admitting: Family Medicine

## 2022-02-13 DIAGNOSIS — I10 Essential (primary) hypertension: Secondary | ICD-10-CM

## 2022-04-10 IMAGING — CT CT CARDIAC CORONARY ARTERY CALCIUM SCORE
3 series · 14 of 20 positions shown, 16 images · non-contrast
Comparison: None.
COMPARISON: None.

Addendum:
EXAM:
OVER-READ INTERPRETATION  CT CHEST

The following report is an over-read performed by radiologist Dr.
Chavela Strain [REDACTED] on 08/22/2021. This
over-read does not include interpretation of cardiac or coronary
anatomy or pathology. The coronary calcium score interpretation by
the cardiologist is attached.
CLINICAL DATA: Risk stratification
Coronary Calcium Score
TECHNIQUE: The patient was scanned on a Siemens Somatom go.Top Scanner. Axial
non-contrast 3 mm slices were carried out through the heart. The
data set was analyzed on a dedicated work station and scored using
the Agatson method.

[Series 2: sa36 calcium scoring 3.00 · axial · 0.35mm/px · z∈[-1321,-1222]mm · 4 of 57 slices shown]
[im 12/57  vessel]
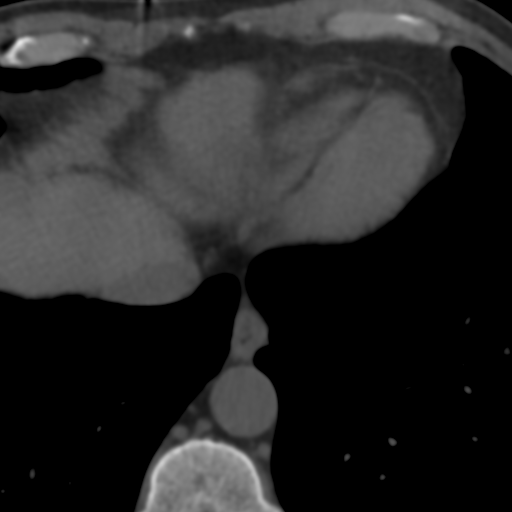
[im 23/57  vessel]
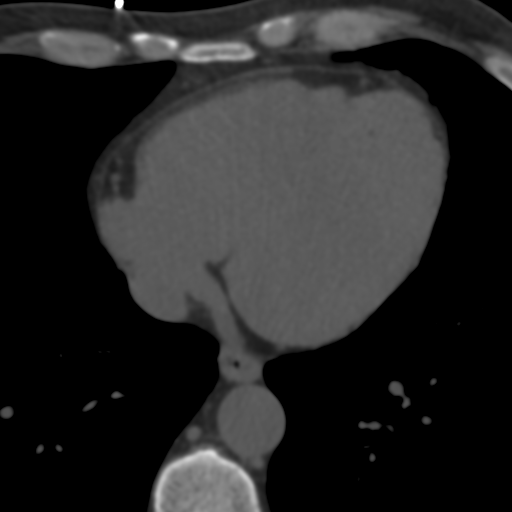
[im 34/57  vessel]
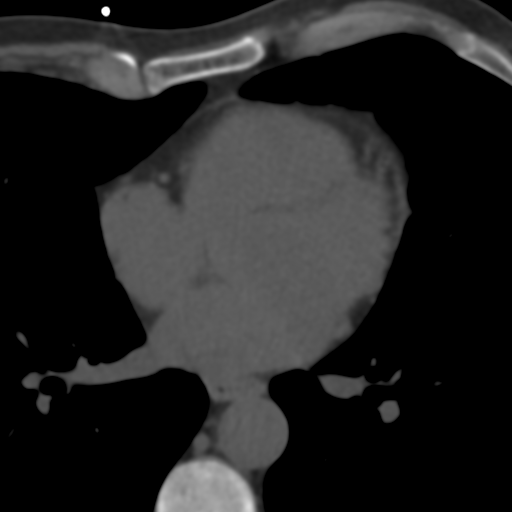
[im 45/57  vessel]
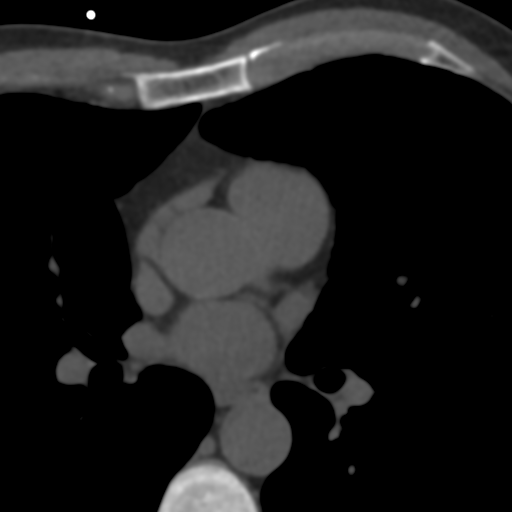

[Series 5: full fov st calcium scoring 3.00 · axial · 0.66mm/px · z∈[-1327,-1216]mm · 5 of 57 slices shown, 7 images]
[im 10/57  vessel]
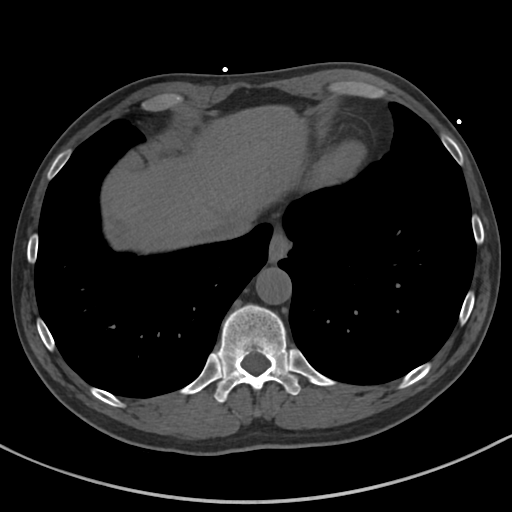
[im 10/57  lung]
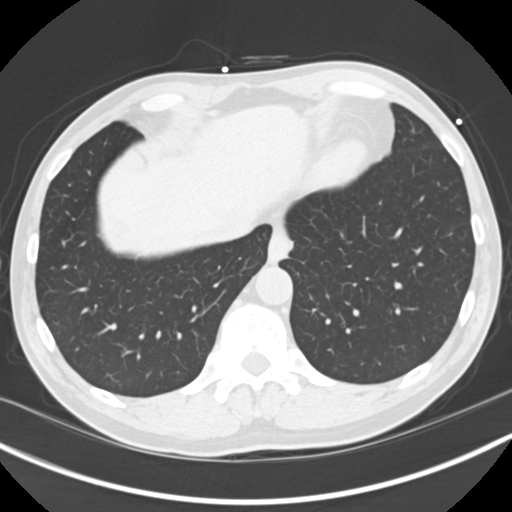
[im 19/57  vessel]
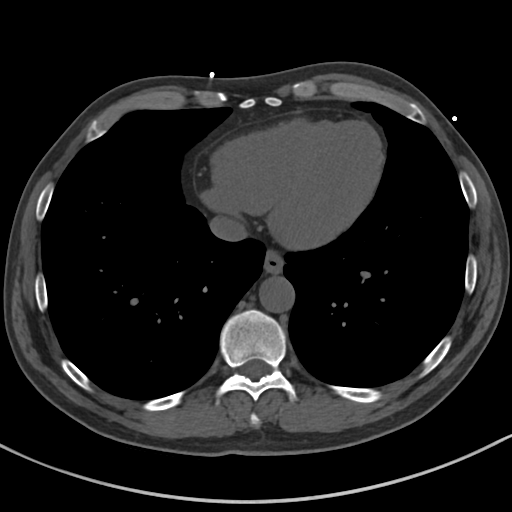
[im 29/57  vessel]
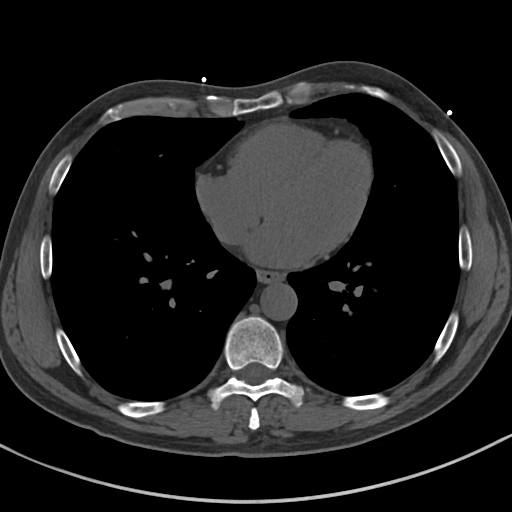
[im 38/57  vessel]
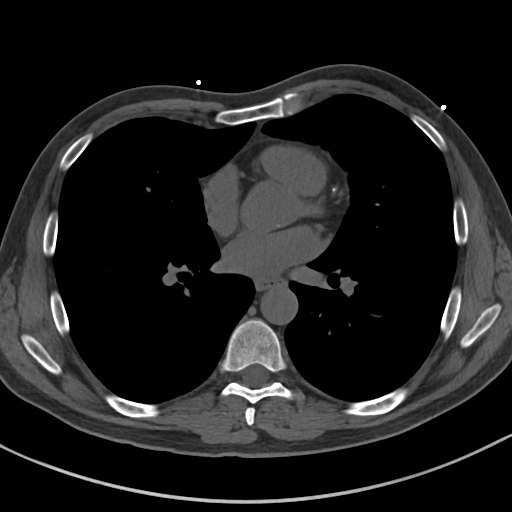
[im 47/57  vessel]
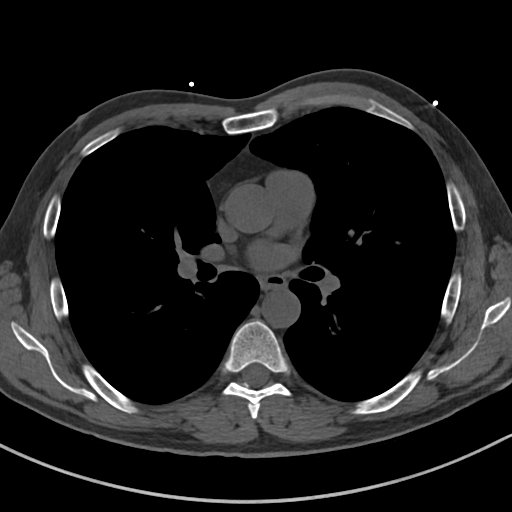
[im 47/57  lung]
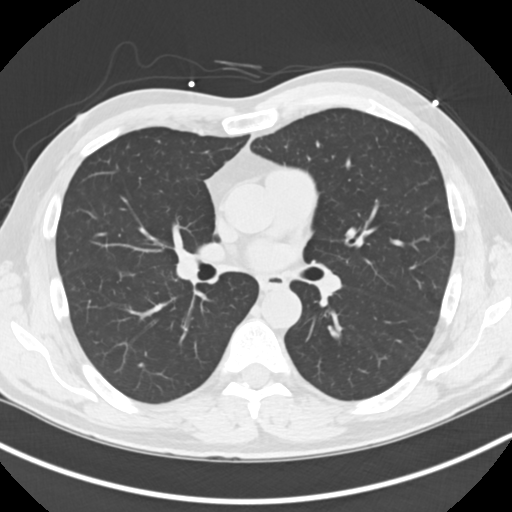

[Series 10: full fov lungs calcium scoring 3.00 ax · axial · 0.66mm/px · z∈[-1327,-1216]mm · 5 of 57 slices shown]
[im 10/57  vessel]
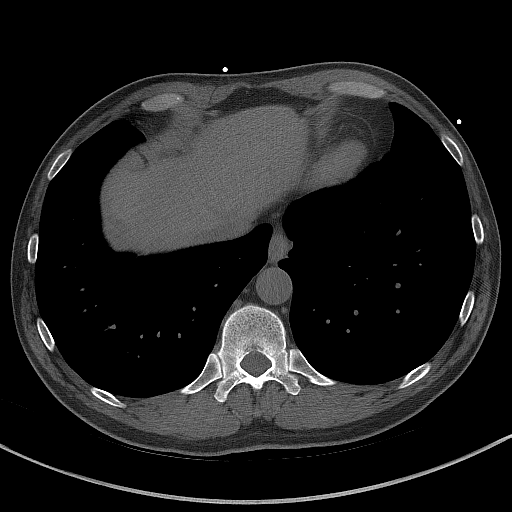
[im 19/57  vessel]
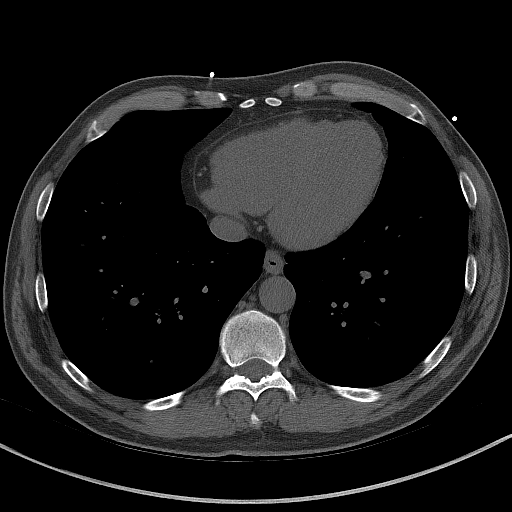
[im 29/57  vessel]
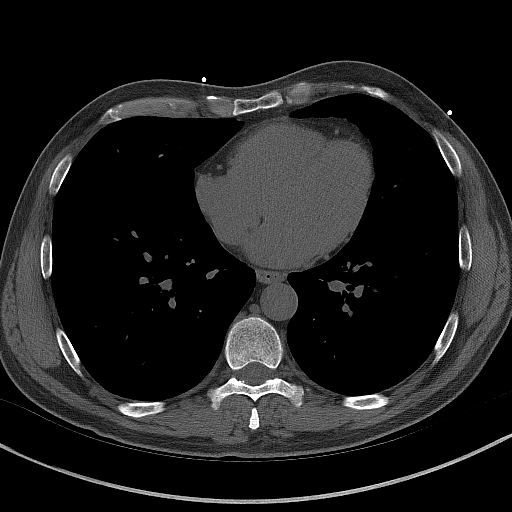
[im 38/57  vessel]
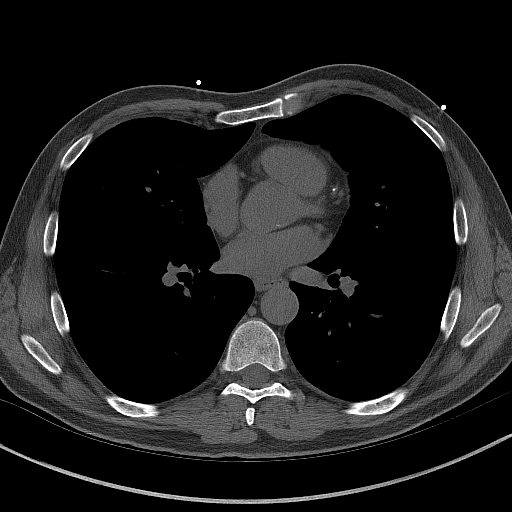
[im 47/57  vessel]
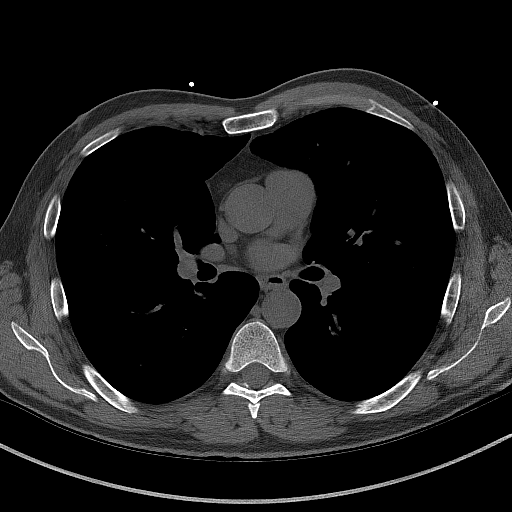

[14 of 20 positions shown; findings below may reference images not displayed]

FINDINGS: Within the visualized portions of the thorax there are no suspicious
appearing pulmonary nodules or masses, there is no acute
consolidative airspace disease, no pleural effusions, no
pneumothorax and no lymphadenopathy. Visualized portions of the
upper abdomen are unremarkable. There are no aggressive appearing
lytic or blastic lesions noted in the visualized portions of the
skeleton.
IMPRESSION: 1. No significant incidental noncardiac findings are noted.
FINDINGS: Non-cardiac: See separate report from [REDACTED].

Ascending Aorta: Normal size

Pericardium: Normal

Coronary arteries: Normal origin of left and right coronary
arteries. Distribution of arterial calcifications if present, as
noted below;

LM 0

LAD

LCx 0

RCA 0

Total

IMPRESSION AND RECOMMENDATION:
1. Coronary calcium score of 6.75. This was 47th percentile for age
and sex matched control.

2. CAC 1-99 in LAD. ERANDY PF1/N1.

3. Continue heart healthy lifestyle and risk factor modification.

Baruhuke Devote

*** End of Addendum ***
EXAM:
OVER-READ INTERPRETATION  CT CHEST

The following report is an over-read performed by radiologist Dr.
Chavela Strain [REDACTED] on 08/22/2021. This
over-read does not include interpretation of cardiac or coronary
anatomy or pathology. The coronary calcium score interpretation by
the cardiologist is attached.
FINDINGS: Within the visualized portions of the thorax there are no suspicious
appearing pulmonary nodules or masses, there is no acute
consolidative airspace disease, no pleural effusions, no
pneumothorax and no lymphadenopathy. Visualized portions of the
upper abdomen are unremarkable. There are no aggressive appearing
lytic or blastic lesions noted in the visualized portions of the
skeleton.
IMPRESSION: 1. No significant incidental noncardiac findings are noted.

## 2022-04-23 ENCOUNTER — Other Ambulatory Visit: Payer: Self-pay | Admitting: Family Medicine

## 2022-04-23 DIAGNOSIS — I10 Essential (primary) hypertension: Secondary | ICD-10-CM

## 2022-04-23 NOTE — Telephone Encounter (Signed)
Pt called in to request a refill for his amLODipine (NORVASC) 2.5 MG tablet . Pt says  that he is completely out of his medication and would like to know if he can have a refill until his follow up?    Pharmacy:  [id number] [address] [place] [address]  [phone] Fax:  [phone]     Future ov: 05/11/22 (providers next available)   Please advise.

## 2022-04-23 NOTE — Telephone Encounter (Signed)
Pt out of medicine , asking for enough to last until his follow up.

## 2022-04-27 NOTE — Telephone Encounter (Signed)
Rx 02/13/22 #90 1RF- too soon Requested Prescriptions  Pending Prescriptions Disp Refills  . amLODipine (NORVASC) 2.5 MG tablet 90 tablet 1    Sig: Take 1 tablet (2.5 mg total) by mouth daily.     Cardiovascular: Calcium Channel Blockers 2 Failed - 04/23/2022  1:02 PM      Failed - Last BP in normal range    BP Readings from Last 1 Encounters:  12/30/21 (!) 157/98         Failed - Valid encounter within last 6 months    Recent Outpatient Visits          8 months ago Annual physical exam   [facility] Birdie Sons, MD   1 year ago Essential hypertension   [facility] Birdie Sons, MD   1 year ago RUQ pain   [facility] Birdie Sons, MD   1 year ago Annual physical exam   [facility] Birdie Sons, MD   3 years ago Annual physical exam   [place], [place]      Future Appointments            In 2 weeks Caryn Section, Kirstie Peri, MD [facility], [place]   In 3 months Caryn Section, Kirstie Peri, MD [facility], [place] in normal range    Pulse Readings from Last 1 Encounters:  12/30/21 68

## 2022-05-11 ENCOUNTER — Encounter: Payer: Self-pay | Admitting: Family Medicine

## 2022-05-11 ENCOUNTER — Ambulatory Visit: Payer: Managed Care, Other (non HMO) | Admitting: Family Medicine

## 2022-05-11 DIAGNOSIS — I10 Essential (primary) hypertension: Secondary | ICD-10-CM

## 2022-05-11 MED ORDER — AMLODIPINE BESYLATE 2.5 MG PO TABS: 2.5000 mg | ORAL_TABLET | Freq: Every day | ORAL | 1 refills | Status: DC

## 2022-05-11 NOTE — Progress Notes (Signed)
I,Roshena L Chambers,acting as a scribe for Lelon Huh, MD.,have documented all relevant documentation on the behalf of Lelon Huh, MD,as directed by  Lelon Huh, MD while in the presence of Lelon Huh, MD.   Established patient visit   Patient: Martin Holland   DOB: [birth year]   56 y.o. Male  MRN: [id number] Visit Date: 05/11/2022  Today's healthcare provider: Lelon Huh, MD   No chief complaint on file.  Subjective    HPI  Hypertension, follow-up  BP Readings from Last 3 Encounters:  05/11/22 (!) 148/99  12/30/21 (!) 157/98  08/15/21 (!) 157/98   Wt Readings from Last 3 Encounters:  05/11/22 174 lb (78.9 kg)  12/30/21 171 lb (77.6 kg)  08/15/21 170 lb (77.1 kg)     He was last seen for hypertension 8 months ago.  BP at that visit was 157/98. Management since that visit includes continuing same medication.  He reports fair compliance with treatment (has been out of amlodipine for the past 2 weeks but is still taking hctz). He is not having side effects.  He is following a Regular diet. He is exercising. He does smoke.  Use of agents associated with hypertension: NSAIDS.   Outside blood pressures are checked occasionally and average 130-140/ 90. Symptoms: No chest pain No chest pressure  No palpitations No syncope  No dyspnea No orthopnea  No paroxysmal nocturnal dyspnea No lower extremity edema   Pertinent labs Lab Results  Component Value Date   CHOL 218 (H) 08/15/2021   HDL 53 08/15/2021   LDLCALC 137 (H) 08/15/2021   TRIG 158 (H) 08/15/2021   CHOLHDL 4.1 08/15/2021   Lab Results  Component Value Date   NA 141 08/15/2021   K 4.5 08/15/2021   CREATININE 1.01 08/15/2021   EGFR 87 08/15/2021   GLUCOSE 101 (H) 08/15/2021   TSH 1.730 06/03/2021     The 10-year ASCVD risk score (Arnett DK, et al., 2019) is: 10.4%  ---------------------------------------------------------------------------------------------------   Lipid/Cholesterol,  Follow-up  Last lipid panel Other pertinent labs  Lab Results  Component Value Date   CHOL 218 (H) 08/15/2021   HDL 53 08/15/2021   LDLCALC 137 (H) 08/15/2021   TRIG 158 (H) 08/15/2021   CHOLHDL 4.1 08/15/2021   Lab Results  Component Value Date   ALT 26 08/15/2021   AST 22 08/15/2021   PLT 214 08/15/2021   TSH 1.730 06/03/2021     He was last seen for this 8 months ago.  From that visit, labs were ordered.  He reports good compliance with treatment. He is not having side effects.   Symptoms: No chest pain No chest pressure/discomfort  No dyspnea No lower extremity edema  No numbness or tingling of extremity No orthopnea  No palpitations No paroxysmal nocturnal dyspnea  No speech difficulty No syncope   Current diet: well balanced Current exercise: walking  The 10-year ASCVD risk score (Arnett DK, et al., 2019) is: 10.4%  ---------------------------------------------------------------------------------------------------   Medications: Outpatient Medications Prior to Visit  Medication Sig   amLODipine (NORVASC) 2.5 MG tablet TAKE 1 TABLET BY MOUTH EVERY DAY   aspirin EC 81 MG tablet Take 1 tablet (81 mg total) by mouth daily. Swallow whole. (Patient taking differently: Take 162 mg by mouth daily. Swallow whole.)   ezetimibe (ZETIA) 10 MG tablet Take 1 tablet (10 mg total) by mouth daily.   hydrochlorothiazide (MICROZIDE) 12.5 MG capsule TAKE 1 CAPSULE BY MOUTH EVERY DAY   loteprednol (LOTEMAX) 0.5 %  ophthalmic suspension Place 1 drop into the left eye 4 (four) times daily.   MULTIPLE VITAMIN PO Take 1 capsule by mouth daily. Reported on 05/11/2016   No facility-administered medications prior to visit.    Review of Systems  Constitutional:  Negative for appetite change, chills and fever.  Respiratory:  Negative for chest tightness, shortness of breath and wheezing.   Cardiovascular:  Negative for chest pain and palpitations.  Gastrointestinal:  Negative for  abdominal pain, nausea and vomiting.       Objective    BP (!) 148/99 (BP Location: Left Arm, Patient Position: Sitting, Cuff Size: Normal)   Pulse 60   Temp 97.8 F (36.6 C) (Oral)   Resp 14   Wt 174 lb (78.9 kg)   SpO2 98% Comment: room air  BMI 24.27 kg/m   Today's Vitals   05/11/22 1314 05/11/22 1317  BP: (!) 154/98 (!) 148/99  Pulse: 60   Resp: 14   Temp: 97.8 F (36.6 C)   TempSrc: Oral   SpO2: 98%   Weight: 174 lb (78.9 kg)    Body mass index is 24.27 kg/m.    Physical Exam   General: Appearance:    Well developed, well nourished male in no acute distress  Eyes:    PERRL, conjunctiva/corneas clear, EOM's intact       Lungs:     Clear to auscultation bilaterally, respirations unlabored  Heart:    Normal heart rate. Normal rhythm. No murmurs, rubs, or gallops.    MS:   All extremities are intact.    Neurologic:   Awake, alert, oriented x 3. No apparent focal neurological defect.         Assessment & Plan     1. Essential hypertension Doing well with hctz and amlodipine although currently out of amlodipine for 2 weeks. Refill sent to his pharmacy and will recheck at CPE that is scheduled in September.      The entirety of the information documented in the History of Present Illness, Review of Systems and Physical Exam were personally obtained by me. Portions of this information were initially documented by the CMA and reviewed by me for thoroughness and accuracy.     Lelon Huh, MD  [facility] [phone] (phone) [phone] (fax)  [place]

## 2022-05-31 ENCOUNTER — Other Ambulatory Visit: Payer: Self-pay | Admitting: Family Medicine

## 2022-07-10 ENCOUNTER — Ambulatory Visit: Payer: Self-pay

## 2022-07-10 MED ORDER — ONDANSETRON HCL 4 MG PO TABS: 4.0000 mg | ORAL_TABLET | Freq: Three times a day (TID) | ORAL | 0 refills | Status: DC | PRN

## 2022-07-10 NOTE — Telephone Encounter (Signed)
Patient called in stating he woke up with a very upset stomach. He states he has taken Zofran in the past for the same thing and it really helped. Can a prescription be called into his pharmacy at   [id number] [place] Phone: [phone] Fax: [phone]    Chief Complaint: Nausea. "I have had stomach issues on and off for awhile." Asking for Zofran to be sent to pharmacy. Symptoms: Nausea Frequency: Today Pertinent Negatives: Patient denies Vomiting Disposition: '[]'$ ED /'[]'$ Urgent Care (no appt availability in office) / '[]'$ Appointment(In office/virtual)/ '[]'$  [facility]/ '[]'$ Home Care/ '[]'$ Refused Recommended Disposition /'[]'$ [facility]/ '[x]'$  Follow-up with PCP Additional Notes: Please advise pt. Thanks.  Answer Assessment - Initial Assessment Questions 1. NAUSEA SEVERITY: "How bad is the nausea?" (e.g., mild, moderate, severe; dehydration, weight loss)   - MILD: loss of appetite without change in eating habits   - MODERATE: decreased oral intake without significant weight loss, dehydration, or malnutrition   - SEVERE: inadequate caloric or fluid intake, significant weight loss, symptoms of dehydration     Mild 2. ONSET: "When did the nausea begin?"     Today 3. VOMITING: "Any vomiting?" If Yes, ask: "How many times today?"     No 4. RECURRENT SYMPTOM: "Have you had nausea before?" If Yes, ask: "When was the last time?" "What happened that time?"     Yes 5. CAUSE: "What do you think is causing the nausea?"     Unsure 6. PREGNANCY: "Is there any chance you are pregnant?" (e.g., unprotected intercourse, missed birth control pill, broken condom)     N/a  Protocols used: Nausea-A-AH

## 2022-07-10 NOTE — Telephone Encounter (Signed)
Please advise 

## 2022-08-18 ENCOUNTER — Ambulatory Visit (INDEPENDENT_AMBULATORY_CARE_PROVIDER_SITE_OTHER): Payer: Managed Care, Other (non HMO) | Admitting: Family Medicine

## 2022-08-18 ENCOUNTER — Encounter: Payer: Self-pay | Admitting: Family Medicine

## 2022-08-18 VITALS — BP 154/107 | HR 70 | Temp 97.8°F | Resp 14 | Ht 70.98 in | Wt 166.6 lb

## 2022-08-18 DIAGNOSIS — I70211 Atherosclerosis of native arteries of extremities with intermittent claudication, right leg: Secondary | ICD-10-CM

## 2022-08-18 DIAGNOSIS — I10 Essential (primary) hypertension: Secondary | ICD-10-CM | POA: Diagnosis not present

## 2022-08-18 DIAGNOSIS — I700 Atherosclerosis of aorta: Secondary | ICD-10-CM

## 2022-08-18 DIAGNOSIS — Z23 Encounter for immunization: Secondary | ICD-10-CM

## 2022-08-18 DIAGNOSIS — E785 Hyperlipidemia, unspecified: Secondary | ICD-10-CM

## 2022-08-18 DIAGNOSIS — Z125 Encounter for screening for malignant neoplasm of prostate: Secondary | ICD-10-CM

## 2022-08-18 DIAGNOSIS — Z0000 Encounter for general adult medical examination without abnormal findings: Secondary | ICD-10-CM

## 2022-08-18 NOTE — Progress Notes (Signed)
I,Roshena L Chambers,acting as a scribe for Lelon Huh, MD.,have documented all relevant documentation on the behalf of Lelon Huh, MD,as directed by  Lelon Huh, MD while in the presence of Lelon Huh, MD.   Complete physical exam   Patient: Martin Holland   DOB: Aug 16, 1966   56 y.o. Male  MRN: [id number] Visit Date: 08/18/2022  Today's healthcare provider: Lelon Huh, MD   Chief Complaint  Patient presents with   Annual Exam   Hyperlipidemia   Subjective    Martin Holland is a 55 y.o. male who presents today for a complete physical exam.  He reports consuming a general diet. Home exercise routine includes walking 3.5 miles daily. He generally feels fairly well. He reports sleeping fairly well. He does not have additional problems to discuss today.  HPI  Hypertension, follow-up  BP Readings from Last 3 Encounters:  08/18/22 (!) 154/107  05/11/22 (!) 148/99  12/30/21 (!) 157/98   Wt Readings from Last 3 Encounters:  08/18/22 166 lb 9.6 oz (75.6 kg)  05/11/22 174 lb (78.9 kg)  12/30/21 171 lb (77.6 kg)     He was last seen for hypertension 3 months ago.  BP at that visit was 148/99. Management since that visit includes continuing same medications.  He reports good compliance with treatment. He is not having side effects.  He is following a Regular diet. He is exercising. He does smoke.  Use of agents associated with hypertension: none.   Outside blood pressures are not checked. Symptoms: No chest pain No chest pressure  No palpitations No syncope  No dyspnea No orthopnea  No paroxysmal nocturnal dyspnea No lower extremity edema     ---------------------------------------------------------------------------------------------------   Lipid/Cholesterol, Follow-up  Last lipid panel Other pertinent labs  Lab Results  Component Value Date   CHOL 218 (H) 08/15/2021   HDL 53 08/15/2021   LDLCALC 137 (H) 08/15/2021   TRIG 158 (H) 08/15/2021    CHOLHDL 4.1 08/15/2021   Lab Results  Component Value Date   ALT 26 08/15/2021   AST 22 08/15/2021   PLT 214 08/15/2021   TSH 1.730 06/03/2021     He was last seen for this 1  year  ago.  Management since that visit includes continuing same medication.  He reports good compliance with treatment. He is not having side effects.   Symptoms: No chest pain No chest pressure/discomfort  No dyspnea No lower extremity edema  No numbness or tingling of extremity No orthopnea  No palpitations No paroxysmal nocturnal dyspnea  No speech difficulty No syncope   Current diet: on average, 1 meals per day Current exercise: walking  The 10-year ASCVD risk score (Arnett DK, et al., 2019) is: 11.2%  ---------------------------------------------------------------------------------------------------   Past Medical History:  Diagnosis Date   Hypercholesteremia    Hypertension    Tobacco abuse    Past Surgical History:  Procedure Laterality Date   COLONOSCOPY WITH PROPOFOL N/A 08/14/2016   Procedure: COLONOSCOPY WITH PROPOFOL;  Surgeon: Lucilla Lame, MD;  Location: [facility];  Service: Endoscopy;  Laterality: N/A;   NO PAST SURGERIES     Social History   Socioeconomic History   Marital status: Single    Spouse name: Not on file   Number of children: 0   Years of education: Not on file   Highest education level: Not on file  Occupational History   Occupation: Employeed    Comment: self employed  Tobacco Use   Smoking status: Former  Packs/day: 0.50    Years: 30.00    Total pack years: 15.00    Types: Cigarettes    Quit date: 06/04/2021    Years since quitting: 1.2   Smokeless tobacco: Never   Tobacco comments:    previously smoked 1/2 ppd since age 52.   Substance and Sexual Activity   Alcohol use: Yes    Alcohol/week: 4.0 standard drinks of alcohol    Types: 4 Cans of beer per week    Comment: monthly   Drug use: Yes    Types: Marijuana   Sexual activity: Not on  file  Other Topics Concern   Not on file  Social History Narrative   Not on file   Social Determinants of Health   Financial Resource Strain: Not on file  Food Insecurity: Not on file  Transportation Needs: Not on file  Physical Activity: Not on file  Stress: Not on file  Social Connections: Not on file  Intimate Partner Violence: Not on file   Family Status  Relation Name Status   Mother  Deceased       small cell carcinoma   Father  Deceased   Sister  Alive   Family History  Problem Relation Age of Onset   Cancer Mother        small cell carcinoma   Heart disease Father    Allergies  Allergen Reactions   Pravastatin Sodium     Severe muscle cramps   Rosuvastatin     Ocular migraines   Valsartan     Vision disturbance    Patient Care Team: Birdie Sons, MD as PCP - General (Family Medicine) Margaretha Sheffield, MD (Otolaryngology)   Medications: Outpatient Medications Prior to Visit  Medication Sig   amLODipine (NORVASC) 2.5 MG tablet Take 1 tablet (2.5 mg total) by mouth daily.   aspirin EC 81 MG tablet Take 1 tablet (81 mg total) by mouth daily. Swallow whole. (Patient taking differently: Take 162 mg by mouth daily. Swallow whole.)   ezetimibe (ZETIA) 10 MG tablet Take 1 tablet (10 mg total) by mouth daily.(Patient not taking)   hydrochlorothiazide (MICROZIDE) 12.5 MG capsule TAKE 1 CAPSULE BY MOUTH EVERY DAY   loteprednol (LOTEMAX) 0.5 % ophthalmic suspension Place 1 drop into the left eye 4 (four) times daily.   MULTIPLE VITAMIN PO Take 1 capsule by mouth daily. Reported on 05/11/2016   [DISCONTINUED] ondansetron (ZOFRAN) 4 MG tablet Take 1 tablet (4 mg total) by mouth every 8 (eight) hours as needed for nausea or vomiting. (Patient not taking: Reported on 08/18/2022)   No facility-administered medications prior to visit.    Review of Systems  Constitutional:  Negative for appetite change, chills, fatigue and fever.  HENT:  Negative for congestion, ear pain,  hearing loss, nosebleeds and trouble swallowing.   Eyes:  Negative for pain and visual disturbance.  Respiratory:  Negative for cough, chest tightness and shortness of breath.   Cardiovascular:  Negative for chest pain, palpitations and leg swelling.  Gastrointestinal:  Negative for abdominal pain, blood in stool, constipation, diarrhea, nausea and vomiting.  Endocrine: Negative for polydipsia, polyphagia and polyuria.  Genitourinary:  Negative for dysuria and flank pain.  Musculoskeletal:  Negative for arthralgias, back pain, joint swelling, myalgias and neck stiffness.  Skin:  Negative for color change, rash and wound.  Neurological:  Negative for dizziness, tremors, seizures, speech difficulty, weakness, light-headedness and headaches.  Psychiatric/Behavioral:  Negative for behavioral problems, confusion, decreased concentration, dysphoric mood and sleep  disturbance. The patient is not nervous/anxious.   All other systems reviewed and are negative.     Objective     BP (!) 154/107 (BP Location: Right Arm, Patient Position: Sitting, Cuff Size: Normal)   Pulse 70   Temp 97.8 F (36.6 C) (Oral)   Resp 14   Ht 5' 10.98" (1.803 m)   Wt 166 lb 9.6 oz (75.6 kg)   SpO2 99% Comment: room air  BMI 23.25 kg/m     Physical Exam   General Appearance:    Well developed, well nourished male. Alert, cooperative, in no acute distress, appears stated age  Head:    Normocephalic, without obvious abnormality, atraumatic  Eyes:    PERRL, conjunctiva/corneas clear, EOM's intact, fundi    benign, both eyes       Ears:    Normal TM's and external ear canals, both ears  Nose:   Nares normal, septum midline, mucosa normal, no drainage   or sinus tenderness  Throat:   Lips, mucosa, and tongue normal; teeth and gums normal  Neck:   Supple, symmetrical, trachea midline, no adenopathy;       thyroid:  No enlargement/tenderness/nodules; no carotid   bruit or JVD  Back:     Symmetric, no curvature,  ROM normal, no CVA tenderness  Lungs:     Clear to auscultation bilaterally, respirations unlabored  Chest wall:    No tenderness or deformity  Heart:    Normal heart rate. Normal rhythm. No murmurs, rubs, or gallops.  S1 and S2 normal  Abdomen:     Soft, non-tender, bowel sounds active all four quadrants,    no masses, no organomegaly  Genitalia:    deferred  Rectal:    deferred  Extremities:   All extremities are intact. No cyanosis or edema  Pulses:   2+ and symmetric all extremities  Skin:   Skin color, texture, turgor normal, no rashes or lesions  Lymph nodes:   Cervical, supraclavicular, and axillary nodes normal  Neurologic:   CNII-XII intact. Normal strength, sensation and reflexes      throughout     Last depression screening scores    08/18/2022    9:16 AM 05/11/2022    1:17 PM 08/15/2021    2:23 PM  PHQ 2/9 Scores  PHQ - 2 Score 0 0   PHQ- 9 Score 1 0   Exception Documentation   Patient refusal   Last fall risk screening    08/18/2022    9:17 AM  [place] in the past year? 0  Number falls in past yr: 0  Injury with Fall? 0  Risk for fall due to : No Fall Risks  Follow up Falls evaluation completed   Last Audit-C alcohol use screening     No data to display         A score of 3 or more in women, and 4 or more in men indicates increased risk for alcohol abuse, EXCEPT if all of the points are from question 1   No results found for any visits on 08/18/22.  Assessment & Plan    Routine Health Maintenance and Physical Exam  Exercise Activities and Dietary recommendations  Goals   None     Immunization History  Administered Date(s) Administered   Influenza,inj,Quad PF,6+ Mos 11/26/2017, 11/25/2018   Td 11/30/2004   Tdap 08/12/2011    Health Maintenance  Topic Date Due   COVID-19 Vaccine (1) Never done  Zoster Vaccines- Shingrix (1 of 2) Never done   TETANUS/TDAP  08/11/2021   INFLUENZA VACCINE  02/28/2023 (Originally 06/30/2022)    COLONOSCOPY (Pts 45-72yr Insurance coverage will need to be confirmed)  08/15/2023   Hepatitis C Screening  Completed   HIV Screening  Completed   HPV VACCINES  Aged Out    Discussed health benefits of physical activity, and encouraged him to engage in regular exercise appropriate for his age and condition.  He declined flu and shingles vaccines today.   2. Prostate cancer screening  - PSA Total (Reflex To Free) ([facility] only)  3. Need for tetanus, diphtheria, and acellular pertussis (Tdap) vaccine in patient of adolescent age or older  - Administer Tetanus-diphtheria-acellular pertussis (Tdap) vaccine  4. Atherosclerosis of native artery of right lower extremity with intermittent claudication ([place] Follow [place] ABIs last done 11/2021 were normal.   5. Aortic atherosclerosis ([place]   6. Primary hypertension BP not at goal. Taking medications consistently but has been under quite a bit of stress lately.  - EKG 12-Lead  7. Hyperlipidemia, unspecified hyperlipidemia type Intolerant to multiple statins and ezetimibe. He has starting taking 1/2 dose fish oil daily. We discussed trial of bempedoic acid if lipids are still elevated.  - Comprehensive metabolic panel - CBC - Lipid panel     The entirety of the information documented in the History of Present Illness, Review of Systems and Physical Exam were personally obtained by me. Portions of this information were initially documented by the CMA and reviewed by me for thoroughness and accuracy.     DLelon Huh MD  [phone](phone) [phone](fax)  [place]

## 2022-08-19 ENCOUNTER — Other Ambulatory Visit: Payer: Self-pay | Admitting: Family Medicine

## 2022-08-19 DIAGNOSIS — E7801 Familial hypercholesterolemia: Secondary | ICD-10-CM

## 2022-08-19 LAB — LIPID PANEL
Chol/HDL Ratio: 5.1 ratio — ABNORMAL HIGH (ref 0.0–5.0)
Cholesterol, Total: 245 mg/dL — ABNORMAL HIGH (ref 100–199)
HDL: 48 mg/dL (ref >39)
LDL Chol Calc (NIH): 183 mg/dL — ABNORMAL HIGH (ref 0–99)
Triglycerides: 80 mg/dL (ref 0–149)
VLDL Cholesterol Cal: 14 mg/dL (ref 5–40)

## 2022-08-19 LAB — PSA TOTAL (REFLEX TO FREE): Prostate Specific Ag, Serum: 0.6 ng/mL (ref 0.0–4.0)

## 2022-08-19 LAB — COMPREHENSIVE METABOLIC PANEL
ALT: 15 IU/L (ref 0–44)
AST: 18 IU/L (ref 0–40)
Albumin/Globulin Ratio: 1.9 (ref 1.2–2.2)
Albumin: 4.8 g/dL (ref 3.8–4.9)
Alkaline Phosphatase: 90 IU/L (ref 44–121)
BUN/Creatinine Ratio: 14 (ref 9–20)
BUN: 15 mg/dL (ref 6–24)
Bilirubin Total: 0.4 mg/dL (ref 0.0–1.2)
CO2: 25 mmol/L (ref 20–29)
Calcium: 9.8 mg/dL (ref 8.7–10.2)
Chloride: 100 mmol/L (ref 96–106)
Creatinine, Ser: 1.11 mg/dL (ref 0.76–1.27)
Globulin, Total: 2.5 g/dL (ref 1.5–4.5)
Glucose: 95 mg/dL (ref 70–99)
Potassium: 3.9 mmol/L (ref 3.5–5.2)
Sodium: 140 mmol/L (ref 134–144)
Total Protein: 7.3 g/dL (ref 6.0–8.5)
eGFR: 77 mL/min/{1.73_m2} (ref >59)

## 2022-08-19 LAB — CBC
Hematocrit: 45.9 % (ref 37.5–51.0)
Hemoglobin: 16.1 g/dL (ref 13.0–17.7)
MCH: 30.8 pg (ref 26.6–33.0)
MCHC: 35.1 g/dL (ref 31.5–35.7)
MCV: 88 fL (ref 79–97)
Platelets: 229 10*3/uL (ref 150–450)
RBC: 5.22 x10E6/uL (ref 4.14–5.80)
RDW: 12.8 % (ref 11.6–15.4)
WBC: 7.2 10*3/uL (ref 3.4–10.8)

## 2022-08-19 MED ORDER — BEMPEDOIC ACID 180 MG PO TABS: 180.0000 mg | ORAL_TABLET | Freq: Every day | ORAL | 0 refills | Status: DC

## 2022-09-10 ENCOUNTER — Telehealth: Payer: Self-pay | Admitting: Family Medicine

## 2022-09-10 NOTE — Telephone Encounter (Signed)
Received PA for Nexletol, but CoverMyMeds would not process because phone number and zip code we have on record does not match the numbers on his pharmacy insurance. Does he have a different phone number or zip code on his insurance plan? Phone on record [phone] Zip on record [phone]

## 2022-09-15 NOTE — Telephone Encounter (Signed)
LMOVM for pt to return call 

## 2022-09-28 ENCOUNTER — Encounter (INDEPENDENT_AMBULATORY_CARE_PROVIDER_SITE_OTHER): Payer: Self-pay

## 2022-10-01 NOTE — Telephone Encounter (Signed)
Tried calling patient. Left message to call back. OK for [facility] triage to speak with patient.

## 2022-10-06 NOTE — Telephone Encounter (Signed)
Tried calling patient. Left message to call back. Since this is the 3rd attempt to reach patient, letter will be mailed to home address.

## 2022-10-12 ENCOUNTER — Ambulatory Visit: Payer: Managed Care, Other (non HMO) | Admitting: Family Medicine

## 2022-10-12 NOTE — Progress Notes (Deleted)
      Established patient visit   Patient: Martin Holland   DOB: 02-Nov-1966   56 y.o. Male  MRN: [id number] Visit Date: 10/12/2022  Today's healthcare provider: Lelon Huh, MD   No chief complaint on file.  Subjective    HPI  Lipid/Cholesterol, Follow-up  Last lipid panel Other pertinent labs  Lab Results  Component Value Date   CHOL 245 (H) 08/18/2022   HDL 48 08/18/2022   LDLCALC 183 (H) 08/18/2022   TRIG 80 08/18/2022   CHOLHDL 5.1 (H) 08/18/2022   Lab Results  Component Value Date   ALT 15 08/18/2022   AST 18 08/18/2022   PLT 229 08/18/2022   TSH 1.730 06/03/2021     He was last seen for this on 08/18/2022.   Management since that visit includes starting a trial of bempedoic acid.  He reports {excellent/good/fair/poor:19665} compliance with treatment. He {is/is not:9024} having side effects. {document side effects if present:1}  Symptoms: {Yes/No:20286} chest pain {Yes/No:20286} chest pressure/discomfort  {Yes/No:20286} dyspnea {Yes/No:20286} lower extremity edema  {Yes/No:20286} numbness or tingling of extremity {Yes/No:20286} orthopnea  {Yes/No:20286} palpitations {Yes/No:20286} paroxysmal nocturnal dyspnea  {Yes/No:20286} speech difficulty {Yes/No:20286} syncope   Current diet: {diet habits:16563} Current exercise: {exercise types:16438}  The 10-year ASCVD risk score (Arnett DK, et al., 2019) is: 13.6%  ---------------------------------------------------------------------------------------------------   Medications: Outpatient Medications Prior to Visit  Medication Sig   amLODipine (NORVASC) 2.5 MG tablet Take 1 tablet (2.5 mg total) by mouth daily.   aspirin EC 81 MG tablet Take 1 tablet (81 mg total) by mouth daily. Swallow whole. (Patient taking differently: Take 162 mg by mouth daily. Swallow whole.)   Bempedoic Acid 180 MG TABS Take 180 mg by mouth daily. For heterozygous familial hypercholesterolemia   hydrochlorothiazide (MICROZIDE) 12.5 MG  capsule TAKE 1 CAPSULE BY MOUTH EVERY DAY   loteprednol (LOTEMAX) 0.5 % ophthalmic suspension Place 1 drop into the left eye 4 (four) times daily.   MULTIPLE VITAMIN PO Take 1 capsule by mouth daily. Reported on 05/11/2016   Omega-3 Fatty Acids (FISH OIL PO) Take by mouth daily.   No facility-administered medications prior to visit.    Review of Systems  {Labs  Heme  Chem  Endocrine  Serology  Results Review (optional):23779}   Objective    There were no vitals taken for this visit. {Show previous vital signs (optional):23777}  Physical Exam  ***  No results found for any visits on 10/12/22.  Assessment & Plan     ***  No follow-ups on file.      {provider attestation***:1}   Lelon Huh, MD  [facility] [phone] (phone) [phone] (fax)  [place]

## 2022-11-10 ENCOUNTER — Other Ambulatory Visit: Payer: Self-pay | Admitting: Family Medicine

## 2022-11-10 DIAGNOSIS — I10 Essential (primary) hypertension: Secondary | ICD-10-CM

## 2022-11-10 MED ORDER — HYDROCHLOROTHIAZIDE 12.5 MG PO CAPS: ORAL_CAPSULE | ORAL | 1 refills | Status: DC

## 2022-11-10 MED ORDER — AMLODIPINE BESYLATE 2.5 MG PO TABS: 2.5000 mg | ORAL_TABLET | Freq: Every day | ORAL | 1 refills | Status: DC

## 2022-11-10 NOTE — Telephone Encounter (Signed)
Medication Refill - Medication: hydrochlorothiazide (MICROZIDE) 12.5 MG capsule , amLODipine (NORVASC) 2.5 MG tablet   Has the patient contacted their pharmacy? No.   Preferred Pharmacy (with phone number or street name):  [id number] [place] [phone] Fax: [phone]    Has the patient been seen for an appointment in the last year OR does the patient have an upcoming appointment? Yes.    Please assist patient further

## 2023-02-07 ENCOUNTER — Encounter: Payer: Self-pay | Admitting: Family Medicine

## 2023-02-08 ENCOUNTER — Other Ambulatory Visit: Payer: Self-pay | Admitting: Family Medicine

## 2023-02-08 DIAGNOSIS — Z86010 Personal history of colonic polyps: Secondary | ICD-10-CM

## 2023-02-15 ENCOUNTER — Telehealth: Payer: Self-pay | Admitting: Gastroenterology

## 2023-02-15 NOTE — Telephone Encounter (Signed)
Returned phone call to schedule colonoscopy.  LVM for pt to return my call.   Thanks, [place], [place]

## 2023-02-15 NOTE — Telephone Encounter (Signed)
Pt returned call to sched colonoscopy would like a call back

## 2023-02-17 ENCOUNTER — Encounter: Payer: Self-pay | Admitting: *Deleted

## 2023-02-25 ENCOUNTER — Other Ambulatory Visit: Payer: Self-pay | Admitting: *Deleted

## 2023-02-25 ENCOUNTER — Telehealth: Payer: Self-pay | Admitting: *Deleted

## 2023-02-25 DIAGNOSIS — Z86010 Personal history of colonic polyps: Secondary | ICD-10-CM

## 2023-02-25 DIAGNOSIS — Z1211 Encounter for screening for malignant neoplasm of colon: Secondary | ICD-10-CM

## 2023-02-25 MED ORDER — NA SULFATE-K SULFATE-MG SULF 17.5-3.13-1.6 GM/177ML PO SOLN: 1.0000 | Freq: Once | ORAL | 0 refills | Status: AC

## 2023-02-25 NOTE — Telephone Encounter (Signed)
Gastroenterology Pre-Procedure Review  Request Date: 08/19/2023 Requesting Physician: Dr. Allen Norris  PATIENT REVIEW QUESTIONS: The patient responded to the following health history questions as indicated:    1. Are you having any GI issues? no 2. Do you have a personal history of Polyps? yes (08/14/2016) 3. Do you have a family history of Colon Cancer or Polyps? no 4. Diabetes Mellitus? no 5. Joint replacements in the past 12 months?no 6. Major health problems in the past 3 months?no 7. Any artificial heart valves, MVP, or defibrillator?no    MEDICATIONS & ALLERGIES:    Patient reports the following regarding taking any anticoagulation/antiplatelet therapy:   Plavix, Coumadin, Eliquis, Xarelto, Lovenox, Pradaxa, Brilinta, or Effient? no Aspirin? no  Patient confirms/reports the following medications:  Current Outpatient Medications  Medication Sig Dispense Refill   Na Sulfate-K Sulfate-Mg Sulf 17.5-3.13-1.6 GM/177ML SOLN Take 1 kit by mouth once for 1 dose. 354 mL 0   amLODipine (NORVASC) 2.5 MG tablet Take 1 tablet (2.5 mg total) by mouth daily. 90 tablet 1   aspirin EC 81 MG tablet Take 1 tablet (81 mg total) by mouth daily. Swallow whole. (Patient taking differently: Take 162 mg by mouth daily. Swallow whole.) 30 tablet 11   Bempedoic Acid 180 MG TABS Take 180 mg by mouth daily. For heterozygous familial hypercholesterolemia 90 tablet 0   hydrochlorothiazide (MICROZIDE) 12.5 MG capsule TAKE 1 CAPSULE BY MOUTH EVERY DAY 90 capsule 1   loteprednol (LOTEMAX) 0.5 % ophthalmic suspension Place 1 drop into the left eye 4 (four) times daily.     MULTIPLE VITAMIN PO Take 1 capsule by mouth daily. Reported on 05/11/2016     Omega-3 Fatty Acids (FISH OIL PO) Take by mouth daily.     No current facility-administered medications for this visit.    Patient confirms/reports the following allergies:  Allergies  Allergen Reactions   Ezetimibe Other (See Comments)    Gi side effects   Pravastatin  Sodium     Severe muscle cramps   Rosuvastatin     Ocular migraines   Valsartan     Vision disturbance    No orders of the defined types were placed in this encounter.   AUTHORIZATION INFORMATION Primary Insurance: 1D#: Group #:  Secondary Insurance: 1D#: Group #:  SCHEDULE INFORMATION: Date: 08/19/2023 Time: Location: [facility]

## 2023-03-31 ENCOUNTER — Emergency Department: Payer: Managed Care, Other (non HMO)

## 2023-03-31 ENCOUNTER — Emergency Department
Admission: EM | Admit: 2023-03-31 | Discharge: 2023-04-01 | Disposition: A | Discharge status: Other Acute Inpatient Hospital | Payer: Managed Care, Other (non HMO) | Attending: Emergency Medicine | Admitting: Emergency Medicine

## 2023-03-31 DIAGNOSIS — D72829 Elevated white blood cell count, unspecified: Secondary | ICD-10-CM | POA: Diagnosis not present

## 2023-03-31 DIAGNOSIS — S0191XA Laceration without foreign body of unspecified part of head, initial encounter: Secondary | ICD-10-CM | POA: Diagnosis not present

## 2023-03-31 DIAGNOSIS — S066XAA Traumatic subarachnoid hemorrhage with loss of consciousness status unknown, initial encounter: Secondary | ICD-10-CM | POA: Diagnosis not present

## 2023-03-31 DIAGNOSIS — I609 Nontraumatic subarachnoid hemorrhage, unspecified: Secondary | ICD-10-CM

## 2023-03-31 DIAGNOSIS — Z79899 Other long term (current) drug therapy: Secondary | ICD-10-CM | POA: Diagnosis not present

## 2023-03-31 DIAGNOSIS — X58XXXA Exposure to other specified factors, initial encounter: Secondary | ICD-10-CM | POA: Insufficient documentation

## 2023-03-31 DIAGNOSIS — I10 Essential (primary) hypertension: Secondary | ICD-10-CM | POA: Diagnosis not present

## 2023-03-31 DIAGNOSIS — Z7982 Long term (current) use of aspirin: Secondary | ICD-10-CM | POA: Diagnosis not present

## 2023-03-31 DIAGNOSIS — I69020 Aphasia following nontraumatic subarachnoid hemorrhage: Secondary | ICD-10-CM | POA: Insufficient documentation

## 2023-03-31 DIAGNOSIS — J9600 Acute respiratory failure, unspecified whether with hypoxia or hypercapnia: Secondary | ICD-10-CM

## 2023-03-31 DIAGNOSIS — R4182 Altered mental status, unspecified: Secondary | ICD-10-CM | POA: Diagnosis present

## 2023-03-31 DIAGNOSIS — S0990XA Unspecified injury of head, initial encounter: Secondary | ICD-10-CM | POA: Diagnosis present

## 2023-03-31 LAB — COMPREHENSIVE METABOLIC PANEL
ALT: 13 U/L (ref 0–44)
AST: 25 U/L (ref 15–41)
Albumin: 4.3 g/dL (ref 3.5–5.0)
Alkaline Phosphatase: 68 U/L (ref 38–126)
Anion gap: 13 (ref 5–15)
BUN: 17 mg/dL (ref 6–20)
CO2: 22 mmol/L (ref 22–32)
Calcium: 9.5 mg/dL (ref 8.9–10.3)
Chloride: 104 mmol/L (ref 98–111)
Creatinine, Ser: 1.03 mg/dL (ref 0.61–1.24)
GFR, Estimated: >60 mL/min (ref >60)
Glucose, Bld: 205 mg/dL — ABNORMAL HIGH (ref 70–99)
Potassium: 3.3 mmol/L — ABNORMAL LOW (ref 3.5–5.1)
Sodium: 139 mmol/L (ref 135–145)
Total Bilirubin: 0.7 mg/dL (ref 0.3–1.2)
Total Protein: 7.4 g/dL (ref 6.5–8.1)

## 2023-03-31 LAB — DIFFERENTIAL
Abs Immature Granulocytes: 0.14 10*3/uL — ABNORMAL HIGH (ref 0.00–0.07)
Basophils Absolute: 0.1 10*3/uL (ref 0.0–0.1)
Basophils Relative: 0 %
Eosinophils Absolute: 0.1 10*3/uL (ref 0.0–0.5)
Eosinophils Relative: 0 %
Immature Granulocytes: 1 %
Lymphocytes Relative: 11 %
Lymphs Abs: 2.3 10*3/uL (ref 0.7–4.0)
Monocytes Absolute: 1.1 10*3/uL — ABNORMAL HIGH (ref 0.1–1.0)
Monocytes Relative: 5 %
Neutro Abs: 17.1 10*3/uL — ABNORMAL HIGH (ref 1.7–7.7)
Neutrophils Relative %: 83 %

## 2023-03-31 LAB — CBC
HCT: 44.7 % (ref 39.0–52.0)
Hemoglobin: 15 g/dL (ref 13.0–17.0)
MCH: 30 pg (ref 26.0–34.0)
MCHC: 33.6 g/dL (ref 30.0–36.0)
MCV: 89.4 fL (ref 80.0–100.0)
Platelets: 253 10*3/uL (ref 150–400)
RBC: 5 MIL/uL (ref 4.22–5.81)
RDW: 13.1 % (ref 11.5–15.5)
WBC: 20.7 10*3/uL — ABNORMAL HIGH (ref 4.0–10.5)
nRBC: 0 % (ref 0.0–0.2)

## 2023-03-31 LAB — URINALYSIS, ROUTINE W REFLEX MICROSCOPIC
Bilirubin Urine: NEGATIVE
Glucose, UA: NEGATIVE mg/dL
Hgb urine dipstick: NEGATIVE
Ketones, ur: 20 mg/dL — AB
Leukocytes,Ua: NEGATIVE
Nitrite: NEGATIVE
Protein, ur: NEGATIVE mg/dL
Specific Gravity, Urine: 1.018 (ref 1.005–1.030)
pH: 6 (ref 5.0–8.0)

## 2023-03-31 LAB — BLOOD GAS, ARTERIAL
Bicarbonate: 29.9 mmol/L — ABNORMAL HIGH (ref 20.0–28.0)
O2 Saturation: 100 %
Patient temperature: 37
pH, Arterial: 7.54 — ABNORMAL HIGH (ref 7.35–7.45)

## 2023-03-31 LAB — APTT: aPTT: 25 seconds (ref 24–36)

## 2023-03-31 LAB — URINE DRUG SCREEN, QUALITATIVE (ARMC ONLY)
Amphetamines, Ur Screen: NOT DETECTED
Barbiturates, Ur Screen: NOT DETECTED
Benzodiazepine, Ur Scrn: NOT DETECTED
Cannabinoid 50 Ng, Ur ~~LOC~~: POSITIVE — AB
Cocaine Metabolite,Ur ~~LOC~~: NOT DETECTED
MDMA (Ecstasy)Ur Screen: NOT DETECTED
Methadone Scn, Ur: NOT DETECTED
Opiate, Ur Screen: NOT DETECTED
Phencyclidine (PCP) Ur S: NOT DETECTED
Tricyclic, Ur Screen: NOT DETECTED

## 2023-03-31 LAB — TYPE AND SCREEN

## 2023-03-31 LAB — PROTIME-INR
INR: 1.1 (ref 0.8–1.2)
Prothrombin Time: 14.4 seconds (ref 11.4–15.2)

## 2023-03-31 LAB — TROPONIN I (HIGH SENSITIVITY): Troponin I (High Sensitivity): 50 ng/L — ABNORMAL HIGH (ref <18)

## 2023-03-31 LAB — CBG MONITORING, ED: Glucose-Capillary: 197 mg/dL — ABNORMAL HIGH (ref 70–99)

## 2023-03-31 LAB — ETHANOL: Alcohol, Ethyl (B): <10 mg/dL (ref <10)

## 2023-03-31 MED ORDER — MIDAZOLAM HCL 2 MG/2ML IJ SOLN
4.0000 mg | Freq: Once | INTRAMUSCULAR | Status: AC
  Administered 2023-03-31: 4 mg via INTRAVENOUS

## 2023-03-31 MED ORDER — PROPOFOL 1000 MG/100ML IV EMUL
5.0000 ug/kg/min | INTRAVENOUS | Status: DC
  Administered 2023-04-01: 70 ug/kg/min via INTRAVENOUS
  Filled 2023-03-31: qty 100

## 2023-03-31 MED ORDER — IOHEXOL 350 MG/ML SOLN
75.0000 mL | Freq: Once | INTRAVENOUS | Status: AC | PRN
  Administered 2023-03-31: 75 mL via INTRAVENOUS

## 2023-03-31 MED ORDER — NICARDIPINE HCL IN NACL 20-0.86 MG/200ML-% IV SOLN
3.0000 mg/h | INTRAVENOUS | Status: DC
  Administered 2023-03-31: 5 mg/h via INTRAVENOUS
  Filled 2023-03-31 (×2): qty 200

## 2023-03-31 MED ORDER — ETOMIDATE 2 MG/ML IV SOLN
INTRAVENOUS | Status: AC
  Administered 2023-03-31: 20 mg via INTRAVENOUS
  Filled 2023-03-31: qty 20

## 2023-03-31 MED ORDER — SODIUM CHLORIDE 0.9 % IV SOLN
0.3000 ug/kg | Freq: Once | INTRAVENOUS | Status: AC
  Administered 2023-03-31: 22.4 ug via INTRAVENOUS
  Filled 2023-03-31: qty 5.6

## 2023-03-31 MED ORDER — ROCURONIUM BROMIDE 10 MG/ML (PF) SYRINGE
PREFILLED_SYRINGE | INTRAVENOUS | Status: AC
  Administered 2023-03-31: 100 mg via INTRAVENOUS
  Filled 2023-03-31: qty 10

## 2023-03-31 MED ORDER — ETOMIDATE 2 MG/ML IV SOLN: 20.0000 mg | Freq: Once | INTRAVENOUS | Status: AC

## 2023-03-31 MED ORDER — PROPOFOL 1000 MG/100ML IV EMUL
INTRAVENOUS | Status: AC
  Administered 2023-03-31: 5 ug/kg/min via INTRAVENOUS
  Filled 2023-03-31: qty 100

## 2023-03-31 MED ORDER — MIDAZOLAM HCL 2 MG/2ML IJ SOLN
INTRAMUSCULAR | Status: AC
  Filled 2023-03-31: qty 4

## 2023-03-31 MED ORDER — MIDAZOLAM HCL 2 MG/2ML IJ SOLN
INTRAMUSCULAR | Status: AC
  Filled 2023-03-31: qty 2

## 2023-03-31 MED ORDER — ROCURONIUM BROMIDE 10 MG/ML (PF) SYRINGE: 100.0000 mg | PREFILLED_SYRINGE | Freq: Once | INTRAVENOUS | Status: AC

## 2023-03-31 NOTE — ED Notes (Signed)
Pt back from CT with Gerilyn Pilgrim, RN, Fleet Contras, RN, and [place], [place]
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
## 2023-03-31 NOTE — ED Notes (Signed)
called to [facility] transfer for updates/rep:amanda.Marland Kitchen
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
## 2023-03-31 NOTE — ED Provider Notes (Signed)
[facility] Provider Note    Event Date/Time   First MD Initiated Contact with Patient 03/31/23 2229     (approximate)   History   No chief complaint on file.   HPI {Remember to add pertinent medical, surgical, social, and/or OB history to HPI:1} Butler Vegh is a 57 y.o. male  ***       Physical Exam   Triage Vital Signs: ED Triage Vitals  Enc Vitals Group     BP 03/31/23 2200 (!) 169/88     Pulse Rate 03/31/23 2200 (!) 51     Resp 03/31/23 2200 (!) 22     Temp --      Temp src --      SpO2 03/31/23 2200 100 %     Weight 03/31/23 2213 164 lb 14.4 oz (74.8 kg)     Height --      Head Circumference --      Peak Flow --      Pain Score --      Pain Loc --      Pain Edu? --      Excl. in GC? --     Most recent vital signs: Vitals:   03/31/23 2200  BP: (!) 169/88  Pulse: (!) 51  Resp: (!) 22  SpO2: 100%    {Only need to document appropriate and relevant physical exam:1} General: Awake, no distress. *** CV:  Good peripheral perfusion. *** Resp:  Normal effort. *** Abd:  No distention. *** Other:  ***   ED Results / Procedures / Treatments   Labs (all labs ordered are listed, but only abnormal results are displayed) Labs Reviewed  CBC - Abnormal; Notable for the following components:      Result Value   WBC 20.7 (*)    All other components within normal limits  DIFFERENTIAL - Abnormal; Notable for the following components:   Neutro Abs 17.1 (*)    Monocytes Absolute 1.1 (*)    Abs Immature Granulocytes 0.14 (*)    All other components within normal limits  CBG MONITORING, ED - Abnormal; Notable for the following components:   Glucose-Capillary 197 (*)    All other components within normal limits  COMPREHENSIVE METABOLIC PANEL  URINE DRUG SCREEN, QUALITATIVE ([facility] ONLY)  URINALYSIS, ROUTINE W REFLEX MICROSCOPIC  PROTIME-INR  APTT  ETHANOL  TYPE AND SCREEN  TROPONIN I (HIGH SENSITIVITY)      EKG  ***   RADIOLOGY *** {USE THE WORD "INTERPRETED"!! You MUST document your own interpretation of imaging, as well as the fact that you reviewed the radiologist's report!:1}   PROCEDURES:  Critical Care performed: {CriticalCareYesNo:19197::"Yes, see critical care procedure note(s)","No"}  Procedures   MEDICATIONS ORDERED IN ED: Medications  etomidate (AMIDATE) injection 20 mg (has no administration in time range)  rocuronium bromide 10 mg/mL (PF) syringe (has no administration in time range)  nicardipine (CARDENE) 20mg  in 0.86% saline IV infusion (0.1 mg/ml) (has no administration in time range)  desmopressin (DDAVP) 22.4 mcg in sodium chloride 0.9 % 50 mL IVPB (has no administration in time range)  propofol (DIPRIVAN) 1000 MG/100ML infusion (5 mcg/kg/min  74.8 kg Intravenous New Bag/Given 03/31/23 2224)     IMPRESSION / MDM / ASSESSMENT AND PLAN / ED COURSE  I reviewed the triage vital signs and the nursing notes.  Differential diagnosis includes, but is not limited to, ***  Patient's presentation is most consistent with {EM COPA:27473}  *** {If the patient is on the monitor, remove the brackets and asterisks on the sentence below and remember to document it as a Procedure as well. Otherwise delete the sentence below:1} {**The patient is on the cardiac monitor to evaluate for evidence of arrhythmia and/or significant heart rate changes.**} {Remember to include, when applicable, any/all of the following data: independent review of imaging independent review of labs (comment specifically on pertinent positives and negatives) review of specific prior hospitalizations, PCP/specialist notes, etc. discuss meds given and prescribed document any discussion with consultants (including hospitalists) any clinical decision tools you used and why (PECARN, NEXUS, etc.) did you consider admitting the patient? document social determinants of  health affecting patient's care (homelessness, inability to follow up in a timely fashion, etc) document any pre-existing conditions increasing risk on current visit (e.g. diabetes and HTN increasing danger of high-risk chest pain/ACS) describes what meds you gave (especially parenteral) and why any other interventions?:1}     FINAL CLINICAL IMPRESSION(S) / ED DIAGNOSES   Final diagnoses:  None     Rx / DC Orders   ED Discharge Orders     None        Note:  This document was prepared using Dragon voice recognition software and may include unintentional dictation errors.

## 2023-03-31 NOTE — ED Notes (Signed)
Family updated as to patient's status. Family at bedside with writer and EDP Siadecki with provided update and plan of care.

## 2023-03-31 NOTE — ED Notes (Signed)
RT called for transport to CT 

## 2023-03-31 NOTE — ED Notes (Signed)
called to [facility] per MD Siadecki/rep:kim.Marland Kitchen

## 2023-03-31 NOTE — ED Notes (Signed)
Pharmacy called requesting DDAVP. Pharmacist states he is working on the med at this time.

## 2023-03-31 NOTE — Progress Notes (Addendum)
Code Stroke Activated @2156  mRS 0 Dr. Linus Salmons at bedside assessing @2156  Patient to CT @2158  TSMD paged @2204  Patient returned to room from CT @2205  Dr. Rosanne Ashing on stroke cart @2215 

## 2023-03-31 NOTE — ED Notes (Signed)
X-ray at bedside at this time.

## 2023-04-01 ENCOUNTER — Emergency Department: Payer: Managed Care, Other (non HMO)

## 2023-04-01 LAB — BLOOD GAS, ARTERIAL
Acid-Base Excess: 7.2 mmol/L — ABNORMAL HIGH (ref 0.0–2.0)
FIO2: 35 %
MECHVT: 500 mL
Mechanical Rate: 14
PEEP: 5 cmH2O
pCO2 arterial: 35 mmHg (ref 32–48)
pO2, Arterial: 171 mmHg — ABNORMAL HIGH (ref 83–108)

## 2023-04-01 NOTE — ED Notes (Signed)
Dr. Erma Heritage reviewed Xray and confirmed new endotrachial tube placement.
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
## 2023-04-01 NOTE — ED Notes (Signed)
Airway cuff found to have leak with need to replace prior to transport. EDP Isaacs and [facility] in room with respiratory. [facility] nurse Tresa Endo updated on delay as well as family, Letta Kocher.

## 2023-04-20 ENCOUNTER — Telehealth: Payer: Self-pay

## 2023-04-20 ENCOUNTER — Other Ambulatory Visit: Payer: Self-pay | Admitting: Family Medicine

## 2023-04-20 DIAGNOSIS — I10 Essential (primary) hypertension: Secondary | ICD-10-CM

## 2023-04-20 NOTE — Transitions of Care (Post Inpatient/ED Visit) (Signed)
   04/20/2023  Name: Martin Holland MRN: [id number] DOB: [birth year]  Today's TOC FU Call Status: Today's TOC FU Call Status:: Successful TOC FU Call Competed TOC FU Call Complete Date: 04/20/23  Transition Care Management Follow-up Telephone Call Date of Discharge: 04/19/23 Discharge Facility: Other ([facility]) Name of Other ([facility]) Discharge Facility: [facility] Type of Discharge: Inpatient Admission Primary Inpatient Discharge Diagnosis:: symbolic dysfunstion How have you been since you were released from the hospital?: Better Any questions or concerns?: No  Items Reviewed: Did you receive and understand the discharge instructions provided?: Yes Medications obtained,verified, and reconciled?: Yes (Medications Reviewed) Any new allergies since your discharge?: No Dietary orders reviewed?: Yes Do you have support at home?: Yes People in Home: sibling(s)  Medications Reviewed Today: Medications Reviewed Today     Reviewed by Karena Addison, LPN (Licensed Practical Nurse) on 04/20/23 at [phone]  Med List Status: <None>   Medication Order Taking? Sig Documenting Provider Last Dose Status Informant  amLODipine (NORVASC) 2.5 MG tablet [id number] Yes Take 1 tablet (2.5 mg total) by mouth daily. Malva Limes, MD Taking Active   aspirin EC 81 MG tablet [id number] Yes Take 1 tablet (81 mg total) by mouth daily. Swallow whole.  Patient taking differently: Take 162 mg by mouth daily. Swallow whole.   Tommie Sams, DO Taking Active   Bempedoic Acid 180 MG TABS [id number] Yes Take 180 mg by mouth daily. For heterozygous familial hypercholesterolemia Malva Limes, MD Taking Active   hydrochlorothiazide (MICROZIDE) 12.5 MG capsule [id number] Yes TAKE 1 CAPSULE BY MOUTH EVERY DAY Malva Limes, MD Taking Active   loteprednol (LOTEMAX) 0.5 % ophthalmic suspension [id number] Yes Place 1 drop into the left eye 4 (four) times daily. [provider] Taking Active   MULTIPLE  VITAMIN PO [id number] Yes Take 1 capsule by mouth daily. Reported on 05/11/2016 [provider] Taking Active            Med Note Hyacinth Meeker, Morene Antu M   Fri Aug 23, 2015  2:21 PM) Received from: [employer] Received Sig: Take by mouth.  Omega-3 Fatty Acids (FISH OIL PO) [id number] Yes Take by mouth daily. [provider] Taking Active             Home Care and Equipment/Supplies: Were Home Health Services Ordered?: NA Any new equipment or medical supplies ordered?: NA  Functional Questionnaire: Do you need assistance with bathing/showering or dressing?: No Do you need assistance with meal preparation?: No Do you need assistance with eating?: No Do you have difficulty maintaining continence: No Do you need assistance with getting out of bed/getting out of a chair/moving?: No Do you have difficulty managing or taking your medications?: No  Follow up appointments reviewed: PCP Follow-up appointment confirmed?: No (declined at this time, will call back later to schedule) Specialist Hospital Follow-up appointment confirmed?: Yes Date of Specialist follow-up appointment?: 05/05/23 Follow-Up Specialty Provider:: Neuro Do you need transportation to your follow-up appointment?: No Do you understand care options if your condition(s) worsen?: Yes-patient verbalized understanding    SIGNATURE Karena Addison, LPN [facility] Nurse Health Advisor Direct Dial [phone]

## 2023-05-24 ENCOUNTER — Telehealth: Payer: Self-pay | Admitting: Family Medicine

## 2023-05-24 NOTE — Telephone Encounter (Signed)
Medication Refill - Medication: Eliquis 5mg   Has the patient contacted their pharmacy? No.  Patient was given this medication @ [facility] when he was hospitalized from May 1st-May 20th. Patient was under the impression that Dr Marolyn Haller at [facility], (neurosurgeon), was going to continue prescribing this medication for him and did not realize otherwise.  Preferred Pharmacy (with phone number or street name): [facility] [phone] [address], [place] [address] Phone: [phone] Fax: [phone]    Has the patient been seen for an appointment in the last year OR does the patient have an upcoming appointment? Yes.  Patient has a hospital f/u scheduled for 06/07/2023 with Dr Sherrie Mustache.

## 2023-05-24 NOTE — Telephone Encounter (Signed)
Eliquis 5mg  not on current list, routing for approval.

## 2023-05-24 NOTE — Telephone Encounter (Signed)
Should continue Eliquis 5mg  BID per discharge summary. Please send in 30 day supply. Should keep hosp f/u with PCP on 7/8

## 2023-05-25 MED ORDER — APIXABAN 5 MG PO TABS: 5.0000 mg | ORAL_TABLET | Freq: Two times a day (BID) | ORAL | 0 refills | Status: DC

## 2023-05-25 NOTE — Telephone Encounter (Signed)
Patient advised as below. Prescription of Eliquis 5 mg sent to pharmacy.

## 2023-06-07 ENCOUNTER — Encounter: Payer: Self-pay | Admitting: Family Medicine

## 2023-06-07 ENCOUNTER — Ambulatory Visit (INDEPENDENT_AMBULATORY_CARE_PROVIDER_SITE_OTHER): Payer: Managed Care, Other (non HMO) | Admitting: Family Medicine

## 2023-06-07 VITALS — BP 127/87 | HR 80 | Ht 71.0 in | Wt 156.0 lb

## 2023-06-07 DIAGNOSIS — G4701 Insomnia due to medical condition: Secondary | ICD-10-CM | POA: Diagnosis not present

## 2023-06-07 DIAGNOSIS — R739 Hyperglycemia, unspecified: Secondary | ICD-10-CM

## 2023-06-07 DIAGNOSIS — I609 Nontraumatic subarachnoid hemorrhage, unspecified: Secondary | ICD-10-CM | POA: Diagnosis not present

## 2023-06-07 DIAGNOSIS — I10 Essential (primary) hypertension: Secondary | ICD-10-CM

## 2023-06-07 DIAGNOSIS — I82411 Acute embolism and thrombosis of right femoral vein: Secondary | ICD-10-CM | POA: Diagnosis not present

## 2023-06-07 NOTE — Progress Notes (Signed)
Established patient visit   Patient: Martin Holland   DOB: 12-20-1965   56 y.o. Male  MRN: [id number] Visit Date: 06/07/2023  Today's healthcare provider: Mila Merry, MD   Chief Complaint  Patient presents with   Hospitalization Follow-up   Subjective    HPI Presents today for follow up hospitalization [facility] in May for spontaneous SAH resulting in have to be intubated and critically ill during 2 1/2 week hospitalization. He was also found to have Nonocclusive deep vein thrombosis in the right common femoral vein with some extension into the right greater saphenous vein at the time of admission. He did have IVC filter placed which was removed prior to discharge. He was discharged on Eliquis. 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 Wyn Quaker.   He reports today that he remains fatigued and has black spot in the vision of his right eye, but otherwise feels well with no other neurologic sequela. He does report difficulty sleeping since return from hospital, and has tried low doses of melatonin. He is taking Eliquis consistently twice every day and tolerating well. He is scheduled to [facility] with Cerebral angiogram in September.   Medications: Outpatient Medications Prior to Visit  Medication Sig   amLODipine (NORVASC) 2.5 MG tablet TAKE 1 TABLET BY MOUTH EVERY DAY   apixaban (ELIQUIS) 5 MG TABS tablet Take 1 tablet (5 mg total) by mouth 2 (two) times daily.   aspirin EC 81 MG tablet Take 1 tablet (81 mg total) by mouth daily. Swallow whole. (Patient taking differently: Take 162 mg by mouth daily. Swallow whole.)   hydrochlorothiazide (MICROZIDE) 12.5 MG capsule TAKE 1 CAPSULE BY MOUTH EVERY DAY   loteprednol (LOTEMAX) 0.5 % ophthalmic suspension Place 1 drop into the left eye 4 (four) times daily.   MULTIPLE VITAMIN PO Take 1 capsule by mouth daily. Reported on 05/11/2016    Omega-3 Fatty Acids (FISH OIL PO) Take by mouth daily.   Bempedoic Acid 180 MG TABS Take 180 mg by mouth daily. For heterozygous familial hypercholesterolemia (Patient not taking: Reported on 06/07/2023)   No facility-administered medications prior to visit.    Review of Systems  Constitutional:  Negative for appetite change, chills and fever.  Respiratory:  Negative for chest tightness, shortness of breath and wheezing.   Cardiovascular:  Negative for chest pain and palpitations.  Gastrointestinal:  Negative for abdominal pain, nausea and vomiting.  Neurological:  Negative for dizziness, tremors, seizures, syncope, speech difficulty, weakness, light-headedness, numbness and headaches.       Objective    BP 127/87 (BP Location: Left Arm, Patient Position: Sitting, Cuff Size: Normal)   Pulse 80   Ht 5\' 11"  (1.803 m)   Wt 156 lb (70.8 kg)   SpO2 98%   BMI 21.76 kg/m    Physical Exam   General: Appearance:    Well developed, well nourished male in no acute distress  Eyes:    PERRL, conjunctiva/corneas clear, EOM's intact       Lungs:     Clear to auscultation bilaterally, respirations unlabored  Heart:    Normal heart rate. Normal rhythm. No murmurs, rubs, or gallops.    MS:   All extremities are intact.    Neurologic:   Awake, alert, oriented x 3. No apparent focal neurological defect.         Assessment & Plan  1. Subarachnoid hemorrhage (HCC) No clear etiology, was critically ill at [facility], but home for about 6 weeks and nearly back to baseline aside from fatigue and visual defect of right eye. Follow up neurology as scheduled.   2. Acute deep vein thrombosis (DVT) of femoral vein of right lower extremity (HCC) Noted on hospital day #2 of SAH. Suspect this was secondary to critical illness. Doing well on Eliquis Anticipate 3-6 month duration, consider hematology work up when finished with Eliquis.  3. Insomnia due to medical condition Likely disturbed circadian rhythm for  prolonged hospitalization and critical illness. Recommend he try 10-20mg  melatonin hs for a few weeks.   4. Primary hypertension Well controlled.  Continue current medications.   - CBC - Comprehensive metabolic panel  5. Hyperglycemia  - Hemoglobin A1c        Mila Merry, MD  [facility] [phone] (phone) [phone] (fax)  [facility]

## 2023-06-07 NOTE — Patient Instructions (Signed)
Please review the attached list of medications and notify my office if there are any errors.   Try taking melatonin 5mg  to 10mg  every night at bedtime .

## 2023-06-08 LAB — COMPREHENSIVE METABOLIC PANEL
ALT: 21 IU/L (ref 0–44)
AST: 16 IU/L (ref 0–40)
Albumin: 4.5 g/dL (ref 3.8–4.9)
Alkaline Phosphatase: 114 IU/L (ref 44–121)
BUN/Creatinine Ratio: 18 (ref 9–20)
BUN: 18 mg/dL (ref 6–24)
Bilirubin Total: 0.2 mg/dL (ref 0.0–1.2)
CO2: 21 mmol/L (ref 20–29)
Calcium: 9.7 mg/dL (ref 8.7–10.2)
Chloride: 102 mmol/L (ref 96–106)
Creatinine, Ser: 0.98 mg/dL (ref 0.76–1.27)
Globulin, Total: 2.6 g/dL (ref 1.5–4.5)
Glucose: 102 mg/dL — ABNORMAL HIGH (ref 70–99)
Potassium: 4.6 mmol/L (ref 3.5–5.2)
Sodium: 141 mmol/L (ref 134–144)
Total Protein: 7.1 g/dL (ref 6.0–8.5)
eGFR: 89 mL/min/{1.73_m2} (ref >59)

## 2023-06-08 LAB — CBC
Hematocrit: 45.6 % (ref 37.5–51.0)
Hemoglobin: 15 g/dL (ref 13.0–17.7)
MCH: 29.5 pg (ref 26.6–33.0)
MCHC: 32.9 g/dL (ref 31.5–35.7)
MCV: 90 fL (ref 79–97)
Platelets: 225 10*3/uL (ref 150–450)
RBC: 5.09 x10E6/uL (ref 4.14–5.80)
RDW: 12.5 % (ref 11.6–15.4)
WBC: 7.4 10*3/uL (ref 3.4–10.8)

## 2023-06-08 LAB — HEMOGLOBIN A1C
Est. average glucose Bld gHb Est-mCnc: 108 mg/dL
Hgb A1c MFr Bld: 5.4 % (ref 4.8–5.6)

## 2023-06-10 ENCOUNTER — Other Ambulatory Visit: Payer: Self-pay | Admitting: Family Medicine

## 2023-06-29 ENCOUNTER — Telehealth: Payer: Self-pay

## 2023-06-29 NOTE — Telephone Encounter (Signed)
Cancel procedure are requested. I did leave message for patient as well.

## 2023-06-29 NOTE — Telephone Encounter (Signed)
Patient is calling because on 03/31/2023 he had a subarachnoid hemorrhage and is on blood thinner now. He states he met with his provider yesterday and they said there is no way he can be off blood thinner right now for his colonoscopy schedule for 08/19/2023. He wants to cancel the colonoscopy that is schedule

## 2023-08-19 ENCOUNTER — Ambulatory Visit: Admit: 2023-08-19 | Payer: Managed Care, Other (non HMO) | Admitting: Gastroenterology

## 2023-08-19 SURGERY — COLONOSCOPY WITH PROPOFOL
Anesthesia: General

## 2023-09-22 ENCOUNTER — Encounter: Payer: Self-pay | Admitting: Family Medicine

## 2023-09-22 ENCOUNTER — Ambulatory Visit (INDEPENDENT_AMBULATORY_CARE_PROVIDER_SITE_OTHER): Payer: Managed Care, Other (non HMO) | Admitting: Family Medicine

## 2023-09-22 VITALS — BP 141/91 | HR 72 | Ht 71.0 in | Wt 177.2 lb

## 2023-09-22 DIAGNOSIS — Z860101 Personal history of adenomatous and serrated colon polyps: Secondary | ICD-10-CM

## 2023-09-22 DIAGNOSIS — I10 Essential (primary) hypertension: Secondary | ICD-10-CM

## 2023-09-22 DIAGNOSIS — Z0000 Encounter for general adult medical examination without abnormal findings: Secondary | ICD-10-CM | POA: Diagnosis not present

## 2023-09-22 DIAGNOSIS — I700 Atherosclerosis of aorta: Secondary | ICD-10-CM | POA: Diagnosis not present

## 2023-09-22 DIAGNOSIS — Z125 Encounter for screening for malignant neoplasm of prostate: Secondary | ICD-10-CM

## 2023-09-22 DIAGNOSIS — E785 Hyperlipidemia, unspecified: Secondary | ICD-10-CM | POA: Diagnosis not present

## 2023-09-22 NOTE — Progress Notes (Signed)
Complete physical exam   Patient: Martin Holland   DOB: [birth year]   57 y.o. Male  MRN: [id number] Visit Date: 09/22/2023  Today's healthcare provider: Mila Merry, MD   Chief Complaint  Patient presents with   Annual Exam    General well balanced diet, walking 5-6 times a week for 20-30 minutes, feel and sleeping well with no other concerns.   Subjective    Discussed the use of AI scribe software for clinical note transcription with the patient, who gave verbal consent to proceed.  History of Present Illness   The patient, with a history of subarachnoid hemorrhage, presented for a routine physical. He reported feeling well overall, with no new health concerns. He has been maintaining an active lifestyle, which includes regular walking. He has not experienced any recent episodes of chest pain or shortness of breath, even with exertion.  The patient had a cerebral angiogram last month, which  showed no abnormalities. He has been advised to continue his current lifestyle and medications. He is currently on HCTZ and Amlodipine for blood pressure management, and Eliquis due to a previous blood clot in the right leg. The patient reported that his blood pressure readings at home are consistently around 141/91.  The patient has a history of right SFA occlusion previously seen by Dr. Wyn Quaker, but ABIs were normal.  However, he has not experienced any recent pain or cramping in the right leg. He has been managing this condition with a better diet and increased exercise, which he believes has led to significant improvements.  He has hyperlipidemia and prescribed pravastatin and rosuvastatin which he did not tolerate. he was prescribed bempedoic aci several months ago bu, has not started on a new cholesterol medication due to insurance issues.  The patient expressed a desire to ensure his medications, specifically HCTZ and Amlodipine, are refilled as needed. He also inquired about the possibility  of discontinuing Eliquis in the future.      HPI     Annual Exam    Additional comments: General well balanced diet, walking 5-6 times a week for 20-30 minutes, feel and sleeping well with no other concerns.      Last edited by Acey Lav, CMA on 09/22/2023  9:18 AM.       Past Medical History:  Diagnosis Date   Allergy    Hypercholesteremia    Hypertension    Tobacco abuse    Past Surgical History:  Procedure Laterality Date   COLONOSCOPY WITH PROPOFOL N/A 08/14/2016   Procedure: COLONOSCOPY WITH PROPOFOL;  Surgeon: Midge Minium, MD;  Location: [facility];  Service: Endoscopy;  Laterality: N/A;   NO PAST SURGERIES     Social History   Socioeconomic History   Marital status: Single    Spouse name: Not on file   Number of children: 0   Years of education: Not on file   Highest education level: Not on file  Occupational History   Occupation: Employeed    Comment: self employed  Tobacco Use   Smoking status: Some Days    Current packs/day: 0.00    Average packs/day: 0.5 packs/day for 30.0 years (15.0 ttl pk-yrs)    Types: Cigarettes    Start date: 06/05/1991    Last attempt to quit: 06/04/2021    Years since quitting: 2.3   Smokeless tobacco: Never   Tobacco comments:    previously smoked 1/2 ppd since age 24.   Substance and Sexual Activity   Alcohol  use: Yes    Alcohol/week: 1.0 standard drink of alcohol    Types: 1 Cans of beer per week    Comment: monthly   Drug use: Not Currently    Types: Marijuana   Sexual activity: Not Currently  Other Topics Concern   Not on file  Social History Narrative   Not on file   Social Determinants of Health   Financial Resource Strain: Not on file  Food Insecurity: Not on file  Transportation Needs: No Transportation Needs (04/02/2023)   Received from [facility], [facility]   [facility] - Transportation    In the past 12 months, has lack of transportation kept you from  medical appointments or from getting medications?: No    Lack of Transportation (Non-Medical): No  Physical Activity: Not on file  Stress: Not on file  Social Connections: Not on file  Intimate Partner Violence: Not on file   Family Status  Relation Name Status   Mother Helios Geltz Deceased       small cell carcinoma   Father Michaeel Furnish Deceased   Sister  Alive  No partnership data on file   Family History  Problem Relation Age of Onset   Cancer Mother        small cell carcinoma   Heart disease Father    Hypertension Father    Stroke Father    Allergies  Allergen Reactions   Ezetimibe Other (See Comments)    Gi side effects   Pravastatin Sodium     Severe muscle cramps   Rosuvastatin     Ocular migraines   Valsartan     Vision disturbance    Patient Care Team: Malva Limes, MD as PCP - General (Family Medicine) Vernie Murders, MD (Otolaryngology)   Medications: Outpatient Medications Prior to Visit  Medication Sig   amLODipine (NORVASC) 2.5 MG tablet TAKE 1 TABLET BY MOUTH EVERY DAY   apixaban (ELIQUIS) 5 MG TABS tablet TAKE 1 TABLET BY MOUTH TWICE A DAY   aspirin EC 81 MG tablet Take 1 tablet (81 mg total) by mouth daily. Swallow whole. (Patient taking differently: Take 162 mg by mouth daily. Swallow whole.)   hydrochlorothiazide (MICROZIDE) 12.5 MG capsule TAKE 1 CAPSULE BY MOUTH EVERY DAY   loteprednol (LOTEMAX) 0.5 % ophthalmic suspension Place 1 drop into the left eye 4 (four) times daily.   MULTIPLE VITAMIN PO Take 1 capsule by mouth daily. Reported on 05/11/2016   Omega-3 Fatty Acids (FISH OIL PO) Take by mouth daily.   Bempedoic Acid 180 MG TABS Take 180 mg by mouth daily. For heterozygous familial hypercholesterolemia (Patient not taking: Reported on 06/07/2023)   No facility-administered medications prior to visit.    Review of Systems  Constitutional:  Negative for chills, diaphoresis and fever.  HENT:  Negative for congestion,  ear discharge, ear pain, hearing loss, nosebleeds, sore throat and tinnitus.   Eyes:  Negative for photophobia, pain, discharge and redness.  Respiratory:  Negative for cough, shortness of breath, wheezing and stridor.   Cardiovascular:  Negative for chest pain, palpitations and leg swelling.  Gastrointestinal:  Negative for abdominal pain, blood in stool, constipation, diarrhea, nausea and vomiting.  Endocrine: Negative for polydipsia.  Genitourinary:  Negative for dysuria, flank pain, frequency, hematuria and urgency.  Musculoskeletal:  Negative for back pain, myalgias and neck pain.  Skin:  Negative for rash.  Allergic/Immunologic: Negative for environmental allergies.  Neurological:  Negative for dizziness, tremors, seizures,  weakness and headaches.  Hematological:  Does not bruise/bleed easily.  Psychiatric/Behavioral:  Negative for hallucinations and suicidal ideas. The patient is not nervous/anxious.       Objective    BP (!) 141/91 (BP Location: Left Arm, Patient Position: Sitting, Cuff Size: Normal)   Pulse 72   Ht 5\' 11"  (1.803 m)   Wt 177 lb 3.2 oz (80.4 kg)   SpO2 98%   BMI 24.71 kg/m     General Appearance:    Well developed, well nourished male. Alert, cooperative, in no acute distress, appears stated age  Head:    Normocephalic, without obvious abnormality, atraumatic  Eyes:    PERRL, conjunctiva/corneas clear, EOM's intact, fundi    benign, both eyes       Ears:    Normal TM's and external ear canals, both ears  Nose:   Nares normal, septum midline, mucosa normal, no drainage   or sinus tenderness  Throat:   Lips, mucosa, and tongue normal; teeth and gums normal  Neck:   Supple, symmetrical, trachea midline, no adenopathy;       thyroid:  No enlargement/tenderness/nodules; no carotid   bruit or JVD  Back:     Symmetric, no curvature, ROM normal, no CVA tenderness  Lungs:     Clear to auscultation bilaterally, respirations unlabored  Chest wall:    No  tenderness or deformity  Heart:    Normal heart rate. Normal rhythm. No murmurs, rubs, or gallops.  S1 and S2 normal  Abdomen:     Soft, non-tender, bowel sounds active all four quadrants,    no masses, no organomegaly  Genitalia:    deferred  Rectal:    deferred  Extremities:   All extremities are intact. No cyanosis or edema  Pulses:   2+ and symmetric all extremities  Skin:   Skin color, texture, turgor normal, no rashes or lesions  Lymph nodes:   Cervical, supraclavicular, and axillary nodes normal  Neurologic:   CNII-XII intact. Normal strength, sensation and reflexes      throughout       Last depression screening scores    09/22/2023    9:26 AM 06/07/2023   10:36 AM 08/18/2022    9:16 AM  PHQ 2/9 Scores  PHQ - 2 Score 0 0 0  PHQ- 9 Score  7 1   Last fall risk screening    09/22/2023    9:26 AM  Fall Risk   Falls in the past year? 1  Number falls in past yr: 1  Injury with Fall? 1  Risk for fall due to : History of fall(s)  Follow up Falls evaluation completed   Last Audit-C alcohol use screening    06/07/2023   10:36 AM  Alcohol Use Disorder Test (AUDIT)  1. How often do you have a drink containing alcohol? 1  2. How many drinks containing alcohol do you have on a typical day when you are drinking? 0  3. How often do you have six or more drinks on one occasion? 0  AUDIT-C Score 1   A score of 3 or more in women, and 4 or more in men indicates increased risk for alcohol abuse, EXCEPT if all of the points are from question 1   No results found for any visits on 09/22/23.  Assessment & Plan    Routine Health Maintenance and Physical Exam  Exercise Activities and Dietary recommendations  Goals   None     Immunization History  Administered Date(s) Administered   Influenza,inj,Quad PF,6+ Mos 11/26/2017, 11/25/2018   Td 11/30/2004   Tdap 08/12/2011, 08/18/2022    Health Maintenance  Topic Date Due   Zoster Vaccines- Shingrix (1 of 2) Never done    INFLUENZA VACCINE  07/01/2023   COVID-19 Vaccine (1 - 2023-24 season) Never done   Colonoscopy  08/15/2023   DTaP/Tdap/Td (4 - Td or Tdap) 08/18/2032   Hepatitis C Screening  Completed   HIV Screening  Completed   HPV VACCINES  Aged Out    Discussed health benefits of physical activity, and encouraged him to engage in regular exercise appropriate for his age and condition.     -PSA    Hypertension  Blood pressure readings consistently around 135-140/85-91. Currently on Amlodipine 2.5mg  and HCTZ. Discussed the importance of maintaining blood pressure around 130/80  -Increase Amlodipine to 5mg  at next refill. -Continue current HCTZ prescription. -Continue monitoring blood pressure at home.  Hyperlipidemia Discussed difficulty with insurance approval for bempedoic acid. Patient has had issues with statins in the past. He did have relatively low CAC score in 2022.  -Check lipid panel today. -Consider trial of Lipitor depending on lipid panel results.  Anticoagulation Currently on Eliquis due to history of DVT in right leg when he was hospitalized for [facility] earlier this year. Patient has not yet followed up with vascular doctors regarding this issue. -Recommend follow-up with Dr. Wyn Quaker to assess need for continued anticoagulation. -Continue Eliquis for now.  Colonoscopy Scheduled but postponed due to Eliquis use. -Recommend rescheduling colonoscopy once anticoagulation plan is clarified with vascular doctors.     No follow-ups on file.        Mila Merry, MD  [facility] [phone] (phone) [phone] (fax)  [facility]

## 2023-09-23 ENCOUNTER — Other Ambulatory Visit: Payer: Self-pay | Admitting: Family Medicine

## 2023-09-23 LAB — COMPREHENSIVE METABOLIC PANEL
ALT: 20 [IU]/L (ref 0–44)
AST: 20 [IU]/L (ref 0–40)
Albumin: 4.4 g/dL (ref 3.8–4.9)
Alkaline Phosphatase: 103 [IU]/L (ref 44–121)
BUN/Creatinine Ratio: 16 (ref 9–20)
BUN: 16 mg/dL (ref 6–24)
Bilirubin Total: 0.3 mg/dL (ref 0.0–1.2)
CO2: 21 mmol/L (ref 20–29)
Calcium: 10 mg/dL (ref 8.7–10.2)
Chloride: 104 mmol/L (ref 96–106)
Creatinine, Ser: 1.01 mg/dL (ref 0.76–1.27)
Globulin, Total: 2.9 g/dL (ref 1.5–4.5)
Glucose: 90 mg/dL (ref 70–99)
Potassium: 4.1 mmol/L (ref 3.5–5.2)
Sodium: 141 mmol/L (ref 134–144)
Total Protein: 7.3 g/dL (ref 6.0–8.5)
eGFR: 86 mL/min/{1.73_m2} (ref >59)

## 2023-09-23 LAB — CBC
Hematocrit: 45.4 % (ref 37.5–51.0)
Hemoglobin: 15.1 g/dL (ref 13.0–17.7)
MCH: 29.3 pg (ref 26.6–33.0)
MCHC: 33.3 g/dL (ref 31.5–35.7)
MCV: 88 fL (ref 79–97)
Platelets: 239 10*3/uL (ref 150–450)
RBC: 5.16 x10E6/uL (ref 4.14–5.80)
RDW: 13.8 % (ref 11.6–15.4)
WBC: 8.1 10*3/uL (ref 3.4–10.8)

## 2023-09-23 LAB — PSA: Prostate Specific Ag, Serum: 0.5 ng/mL (ref 0.0–4.0)

## 2023-09-23 LAB — LIPID PANEL
Chol/HDL Ratio: 6.5 ratio — ABNORMAL HIGH (ref 0.0–5.0)
Cholesterol, Total: 273 mg/dL — ABNORMAL HIGH (ref 100–199)
HDL: 42 mg/dL (ref >39)
LDL Chol Calc (NIH): 189 mg/dL — ABNORMAL HIGH (ref 0–99)
Triglycerides: 217 mg/dL — ABNORMAL HIGH (ref 0–149)
VLDL Cholesterol Cal: 42 mg/dL — ABNORMAL HIGH (ref 5–40)

## 2023-09-23 MED ORDER — PITAVASTATIN CALCIUM 1 MG PO TABS: 1.0000 mg | ORAL_TABLET | Freq: Every day | ORAL | 3 refills | Status: AC

## 2023-10-06 ENCOUNTER — Encounter: Payer: Self-pay | Admitting: Family Medicine

## 2023-10-09 ENCOUNTER — Other Ambulatory Visit: Payer: Self-pay | Admitting: Family Medicine

## 2023-10-11 NOTE — Telephone Encounter (Signed)
Requested Prescriptions  Pending Prescriptions Disp Refills   apixaban (ELIQUIS) 5 MG TABS tablet [Pharmacy Med Name: ELIQUIS 5 MG TABLET] 180 tablet 3    Sig: TAKE 1 TABLET BY MOUTH TWICE A DAY     Hematology:  Anticoagulants - apixaban Passed - 10/09/2023  1:31 PM      Passed - PLT in normal range and within 360 days    Platelets  Date Value Ref Range Status  09/22/2023 239 150 - 450 x10E3/uL Final         Passed - HGB in normal range and within 360 days    Hemoglobin  Date Value Ref Range Status  09/22/2023 15.1 13.0 - 17.7 g/dL Final         Passed - HCT in normal range and within 360 days    Hematocrit  Date Value Ref Range Status  09/22/2023 45.4 37.5 - 51.0 % Final         Passed - Cr in normal range and within 360 days    Creatinine, Ser  Date Value Ref Range Status  09/22/2023 1.01 0.76 - 1.27 mg/dL Final         Passed - AST in normal range and within 360 days    AST  Date Value Ref Range Status  09/22/2023 20 0 - 40 IU/L Final         Passed - ALT in normal range and within 360 days    ALT  Date Value Ref Range Status  09/22/2023 20 0 - 44 IU/L Final         Passed - Valid encounter within last 12 months    Recent Outpatient Visits           2 weeks ago Annual physical exam   [place] [facility] Malva Limes, MD   4 months ago Subarachnoid hemorrhage [facility])   [place] [facility] Malva Limes, MD   1 year ago Annual physical exam   [place] [facility] Malva Limes, MD   1 year ago Essential hypertension   [place] [facility] Malva Limes, MD   2 years ago Annual physical exam   [facility] Malva Limes, MD

## 2023-10-26 ENCOUNTER — Other Ambulatory Visit: Payer: Self-pay | Admitting: Family Medicine

## 2023-10-26 DIAGNOSIS — I10 Essential (primary) hypertension: Secondary | ICD-10-CM

## 2023-10-26 NOTE — Telephone Encounter (Signed)
Medication Refill -  Most Recent Primary Care Visit:  Provider: Malva Limes  Department: [facility]  Visit Type: PHYSICAL  Date: 09/22/2023  Medication: hydrochlorothiazide (MICROZIDE) 12.5 MG capsule   amLODipine (NORVASC) 2.5 MG tablet  *Patient was on a trip and had his medication with him & his luggage got lost, patient states it has been 3 or 4 days and he has no luggage and no medication.   *Patient also wanted to make sure that Dr Sherrie Mustache is updating prescriptions for him, patient states that he recently had a Physical with PCP and discussed with him to make sure he has plenty of refills on these medications at the pharmacy in case this happens again   Has the patient contacted their pharmacy?  No, advised patient he may need to reach out to pharmacy to make sure no refills are there but that I would send the request for him.  Is this the correct pharmacy for this prescription? Yes, the one listed below.  This is the patient's preferred pharmacy:  [id number]  [address] [place] [address] Phone: [phone] Fax: [phone]   Has the prescription been filled recently?  Last fill 04/20/2023  Is the patient out of the medication?  Patient's medication is lost in luggage.  Has the patient been seen for an appointment in the last year OR does the patient have an upcoming appointment? Patient had a CPE with PCP on 10.23.2024

## 2023-10-26 NOTE — Telephone Encounter (Signed)
Requested Prescriptions  Refused Prescriptions Disp Refills   hydrochlorothiazide (MICROZIDE) 12.5 MG capsule 90 capsule 1    Sig: TAKE 1 CAPSULE BY MOUTH EVERY DAY     Cardiovascular: Diuretics - Thiazide Failed - 10/26/2023 10:38 AM      Failed - Last BP in normal range    BP Readings from Last 1 Encounters:  09/22/23 (!) 141/91         Passed - Cr in normal range and within 180 days    Creatinine, Ser  Date Value Ref Range Status  09/22/2023 1.01 0.76 - 1.27 mg/dL Final         Passed - K in normal range and within 180 days    Potassium  Date Value Ref Range Status  09/22/2023 4.1 3.5 - 5.2 mmol/L Final         Passed - Na in normal range and within 180 days    Sodium  Date Value Ref Range Status  09/22/2023 141 134 - 144 mmol/L Final         Passed - Valid encounter within last 6 months    Recent Outpatient Visits           1 month ago Annual physical exam   [place] [facility] Malva Limes, MD   4 months ago Subarachnoid hemorrhage [facility])   [facility] Mal[facility] Limes, MD   1 year ago Annual physical exam   [place] [facility] Malva Limes, MD   1 year ago Essential hypertension   [place] [facility] Malva Limes, MD   2 years ago Annual physical exam   [facility] Malva Limes, MD               amLODipine (NORVASC) 2.5 MG tablet 90 tablet 1    Sig: Take 1 tablet (2.5 mg total) by mouth daily.     Cardiovascular: Calcium Channel Blockers 2 Failed - 10/26/2023 10:38 AM      Failed - Last BP in normal range    BP Readings from Last 1 Encounters:  09/22/23 (!) 141/91         Passed - Last Heart Rate in normal range    Pulse Readings from Last 1 Encounters:  09/22/23 72         Passed - Valid encounter within last 6 months    Recent Outpatient Visits           1 month ago Annual physical exam   [facility] Malva Limes, MD   4 months ago Subarachnoid hemorrhage [facility])   [place] [facility] Malva Limes, MD   1 year ago Annual physical exam   [facility] Malva Limes, MD   1 year ago Essential hypertension   [place] [facility] Malva Limes, MD   2 years ago Annual physical exam   [facility] Malva Limes, MD

## 2023-10-26 NOTE — Telephone Encounter (Signed)
Requested Prescriptions  Pending Prescriptions Disp Refills   amLODipine (NORVASC) 2.5 MG tablet [Pharmacy Med Name: AMLODIPINE BESYLATE 2.5 MG TAB] 90 tablet 1    Sig: TAKE 1 TABLET BY MOUTH EVERY DAY     Cardiovascular: Calcium Channel Blockers 2 Failed - 10/26/2023  9:17 AM      Failed - Last BP in normal range    BP Readings from Last 1 Encounters:  09/22/23 (!) 141/91         Passed - Last Heart Rate in normal range    Pulse Readings from Last 1 Encounters:  09/22/23 72         Passed - Valid encounter within last 6 months    Recent Outpatient Visits           1 month ago Annual physical exam   [place] [facility] Malva Limes, MD   4 months ago Subarachnoid hemorrhage [facility])   [place] [facility] Malva Limes, MD   1 year ago Annual physical exam   [place] [facility] Malva Limes, MD   1 year ago Essential hypertension   [place] [facility] Malva Limes, MD   2 years ago Annual physical exam   [place] [facility] Malva Limes, MD               hydrochlorothiazide (MICROZIDE) 12.5 MG capsule [Pharmacy Med Name: HYDROCHLOROTHIAZIDE 12.5 MG CP] 90 capsule 1    Sig: TAKE 1 CAPSULE BY MOUTH EVERY DAY     Cardiovascular: Diuretics - Thiazide Failed - 10/26/2023  9:17 AM      Failed - Last BP in normal range    BP Readings from Last 1 Encounters:  09/22/23 (!) 141/91         Passed - Cr in normal range and within 180 days    Creatinine, Ser  Date Value Ref Range Status  09/22/2023 1.01 0.76 - 1.27 mg/dL Final         Passed - K in normal range and within 180 days    Potassium  Date Value Ref Range Status  09/22/2023 4.1 3.5 - 5.2 mmol/L Final         Passed - Na in normal range and within 180 days    Sodium  Date Value Ref Range Status  09/22/2023 141 134 - 144 mmol/L Final         Passed - Valid encounter within last 6 months    Recent  Outpatient Visits           1 month ago Annual physical exam   [place] [facility] Malva Limes, MD   4 months ago Subarachnoid hemorrhage [facility])   [place] [facility] Malva Limes, MD   1 year ago Annual physical exam   [facility] Malva Limes, MD   1 year ago Essential hypertension    [facility] Malva Limes, MD   2 years ago Annual physical exam   [facility] Malva Limes, MD

## 2023-10-29 ENCOUNTER — Other Ambulatory Visit: Payer: Self-pay | Admitting: Family Medicine

## 2023-10-29 DIAGNOSIS — I10 Essential (primary) hypertension: Secondary | ICD-10-CM

## 2023-10-29 MED ORDER — AMLODIPINE BESYLATE 5 MG PO TABS: 5.0000 mg | ORAL_TABLET | Freq: Every day | ORAL | 1 refills | Status: DC

## 2024-05-03 ENCOUNTER — Other Ambulatory Visit: Payer: Self-pay | Admitting: Family Medicine

## 2024-05-03 DIAGNOSIS — R911 Solitary pulmonary nodule: Secondary | ICD-10-CM

## 2024-07-07 ENCOUNTER — Telehealth: Payer: Self-pay | Admitting: Family Medicine

## 2024-07-07 DIAGNOSIS — I10 Essential (primary) hypertension: Secondary | ICD-10-CM

## 2024-07-07 NOTE — Telephone Encounter (Signed)
 Copied from CRM [id number]. Topic: Clinical - Medication Refill >> Jul 07, 2024 12:29 PM Tiffini S wrote: Medication: amLODipine  (NORVASC ) 5 MG tablet and  hydrochlorothiazide  (MICROZIDE ) 12.5 MG capsule   Has the patient contacted their pharmacy? No (Agent: If no, request that the patient contact the pharmacy for the refill. If patient does not wish to contact the pharmacy document the reason why and proceed with request.) (Agent: If yes, when and what did the pharmacy advise?)  This is the patient's preferred pharmacy:  [id number] [place], [place] - [address] [place] [address] Phone: [phone] Fax: [phone]  Is this the correct pharmacy for this prescription? Yes If no, delete pharmacy and type the correct one.   Has the prescription been filled recently? Yes  Is the patient out of the medication? No, patient will run out of medication before next appointment by 09/25/24  Has the patient been seen for an appointment in the last year OR does the patient have an upcoming appointment? Yes  Can we respond through MyChart? Yes  Agent: Please be advised that Rx refills may take up to 3 business days. We ask that you follow-up with your pharmacy.

## 2024-07-11 MED ORDER — AMLODIPINE BESYLATE 5 MG PO TABS: 5.0000 mg | ORAL_TABLET | Freq: Every day | ORAL | 1 refills | Status: AC

## 2024-07-11 MED ORDER — HYDROCHLOROTHIAZIDE 12.5 MG PO CAPS: ORAL_CAPSULE | ORAL | 1 refills | Status: AC

## 2024-07-11 NOTE — Telephone Encounter (Signed)
 Requested medication (s) are due for refill today:   Yes for both  Requested medication (s) are on the active medication list:   Yes for both  Future visit scheduled:   Yes 10/27      LOV 11/22/2023    Last ordered: Amlodipine  10/29/2023 #90, 1 refill;   hydrochlorothiazide  10/26/2023 #90, 1 refill   Unable to refill because labs are due and an OV.      Pt will run out of medication before upcoming appt.     Requested Prescriptions  Pending Prescriptions Disp Refills   amLODipine  (NORVASC ) 5 MG tablet 90 tablet 1    Sig: Take 1 tablet (5 mg total) by mouth daily.     Cardiovascular: Calcium  Channel Blockers 2 Failed - 07/11/2024  1:31 PM      Failed - Last BP in normal range    BP Readings from Last 1 Encounters:  09/22/23 (!) 141/91         Failed - Valid encounter within last 6 months    Recent Outpatient Visits   None            Passed - Last Heart Rate in normal range    Pulse Readings from Last 1 Encounters:  09/22/23 72          hydrochlorothiazide  (MICROZIDE ) 12.5 MG capsule 90 capsule 1    Sig: TAKE 1 CAPSULE BY MOUTH EVERY DAY     Cardiovascular: Diuretics - Thiazide Failed - 07/11/2024  1:31 PM      Failed - Cr in normal range and within 180 days    Creatinine, Ser  Date Value Ref Range Status  09/22/2023 1.01 0.76 - 1.27 mg/dL Final         Failed - K in normal range and within 180 days    Potassium  Date Value Ref Range Status  09/22/2023 4.1 3.5 - 5.2 mmol/L Final         Failed - Na in normal range and within 180 days    Sodium  Date Value Ref Range Status  09/22/2023 141 134 - 144 mmol/L Final         Failed - Last BP in normal range    BP Readings from Last 1 Encounters:  09/22/23 (!) 141/91         Failed - Valid encounter within last 6 months    Recent Outpatient Visits   None

## 2024-09-25 ENCOUNTER — Encounter: Admitting: Family Medicine
